# Patient Record
Sex: Male | Born: 1960 | Race: White | Hispanic: No | Smoking: Never smoker
Health system: Southern US, Community
[De-identification: ages and names within clinical notes are randomized; demographics above are authoritative.]

## PROBLEM LIST (undated history)

## (undated) DIAGNOSIS — E785 Hyperlipidemia, unspecified: Secondary | ICD-10-CM

## (undated) DIAGNOSIS — M1990 Unspecified osteoarthritis, unspecified site: Secondary | ICD-10-CM

## (undated) DIAGNOSIS — T7840XA Allergy, unspecified, initial encounter: Secondary | ICD-10-CM

## (undated) HISTORY — PX: TONSILLECTOMY: SUR1361

## (undated) HISTORY — DX: Hyperlipidemia, unspecified: E78.5

## (undated) HISTORY — DX: Allergy, unspecified, initial encounter: T78.40XA

## (undated) HISTORY — PX: NASAL SINUS SURGERY: SHX719

## (undated) HISTORY — PX: BACK SURGERY: SHX140

## (undated) HISTORY — PX: HERNIA REPAIR: SHX51

## (undated) HISTORY — DX: Unspecified osteoarthritis, unspecified site: M19.90

---

## 2001-03-26 ENCOUNTER — Emergency Department (HOSPITAL_COMMUNITY): Admission: EM | Admit: 2001-03-26 | Discharge: 2001-03-26 | Payer: Self-pay | Admitting: Emergency Medicine

## 2001-04-02 ENCOUNTER — Encounter: Payer: Self-pay | Admitting: Neurosurgery

## 2001-04-02 ENCOUNTER — Ambulatory Visit (HOSPITAL_COMMUNITY): Admission: RE | Admit: 2001-04-02 | Discharge: 2001-04-02 | Payer: Self-pay | Admitting: Neurosurgery

## 2001-04-07 ENCOUNTER — Encounter: Payer: Self-pay | Admitting: Neurosurgery

## 2001-04-07 ENCOUNTER — Encounter: Admission: RE | Admit: 2001-04-07 | Discharge: 2001-04-07 | Payer: Self-pay | Admitting: Neurosurgery

## 2001-04-19 ENCOUNTER — Encounter: Payer: Self-pay | Admitting: Neurosurgery

## 2001-04-19 ENCOUNTER — Encounter: Admission: RE | Admit: 2001-04-19 | Discharge: 2001-04-19 | Payer: Self-pay | Admitting: Neurosurgery

## 2001-05-04 ENCOUNTER — Encounter: Payer: Self-pay | Admitting: Neurosurgery

## 2001-05-04 ENCOUNTER — Inpatient Hospital Stay (HOSPITAL_COMMUNITY): Admission: RE | Admit: 2001-05-04 | Discharge: 2001-05-05 | Payer: Self-pay | Admitting: Neurosurgery

## 2001-06-16 ENCOUNTER — Encounter: Payer: Self-pay | Admitting: Gastroenterology

## 2002-06-10 ENCOUNTER — Encounter: Admission: RE | Admit: 2002-06-10 | Discharge: 2002-06-10 | Payer: Self-pay | Admitting: Emergency Medicine

## 2002-06-10 ENCOUNTER — Encounter: Payer: Self-pay | Admitting: Emergency Medicine

## 2002-07-20 ENCOUNTER — Inpatient Hospital Stay (HOSPITAL_COMMUNITY): Admission: RE | Admit: 2002-07-20 | Discharge: 2002-07-21 | Payer: Self-pay | Admitting: Neurosurgery

## 2002-07-20 ENCOUNTER — Encounter: Payer: Self-pay | Admitting: Neurosurgery

## 2004-06-24 ENCOUNTER — Ambulatory Visit (HOSPITAL_BASED_OUTPATIENT_CLINIC_OR_DEPARTMENT_OTHER): Admission: RE | Admit: 2004-06-24 | Discharge: 2004-06-24 | Payer: Self-pay | Admitting: Otolaryngology

## 2004-06-24 ENCOUNTER — Ambulatory Visit (HOSPITAL_COMMUNITY): Admission: RE | Admit: 2004-06-24 | Discharge: 2004-06-24 | Payer: Self-pay | Admitting: Otolaryngology

## 2004-11-12 ENCOUNTER — Emergency Department (HOSPITAL_COMMUNITY): Admission: EM | Admit: 2004-11-12 | Discharge: 2004-11-12 | Payer: Self-pay | Admitting: Emergency Medicine

## 2004-12-10 ENCOUNTER — Ambulatory Visit: Payer: Self-pay | Admitting: Family Medicine

## 2004-12-17 ENCOUNTER — Ambulatory Visit: Payer: Self-pay | Admitting: Family Medicine

## 2005-02-03 ENCOUNTER — Ambulatory Visit: Payer: Self-pay | Admitting: Family Medicine

## 2006-07-07 ENCOUNTER — Ambulatory Visit: Payer: Self-pay | Admitting: Family Medicine

## 2006-07-31 ENCOUNTER — Ambulatory Visit (HOSPITAL_BASED_OUTPATIENT_CLINIC_OR_DEPARTMENT_OTHER): Admission: RE | Admit: 2006-07-31 | Discharge: 2006-07-31 | Payer: Self-pay | Admitting: Urology

## 2006-07-31 ENCOUNTER — Encounter (INDEPENDENT_AMBULATORY_CARE_PROVIDER_SITE_OTHER): Payer: Self-pay | Admitting: Specialist

## 2006-08-31 ENCOUNTER — Ambulatory Visit: Payer: Self-pay | Admitting: Family Medicine

## 2006-08-31 LAB — CONVERTED CEMR LAB
Albumin: 3.8 g/dL (ref 3.5–5.2)
Basophils Absolute: 0 10*3/uL (ref 0.0–0.1)
Bilirubin, Direct: 0.1 mg/dL (ref 0.0–0.3)
CO2: 30 meq/L (ref 19–32)
Cholesterol: 144 mg/dL (ref 0–200)
Creatinine, Ser: 0.9 mg/dL (ref 0.4–1.5)
Glucose, Bld: 92 mg/dL (ref 70–99)
HCT: 37.7 % — ABNORMAL LOW (ref 39.0–52.0)
Hemoglobin: 12.6 g/dL — ABNORMAL LOW (ref 13.0–17.0)
LDL Cholesterol: 83 mg/dL (ref 0–99)
MCHC: 33.4 g/dL (ref 30.0–36.0)
Monocytes Absolute: 0.6 10*3/uL (ref 0.2–0.7)
Neutrophils Relative %: 48 % (ref 43.0–77.0)
Potassium: 3.9 meq/L (ref 3.5–5.1)
RDW: 12.1 % (ref 11.5–14.6)
Sodium: 143 meq/L (ref 135–145)
TSH: 0.04 microintl units/mL — ABNORMAL LOW (ref 0.35–5.50)
Total Bilirubin: 0.9 mg/dL (ref 0.3–1.2)
Total Protein: 7.3 g/dL (ref 6.0–8.3)

## 2006-09-07 ENCOUNTER — Ambulatory Visit: Payer: Self-pay | Admitting: Family Medicine

## 2006-10-12 ENCOUNTER — Ambulatory Visit: Payer: Self-pay | Admitting: Family Medicine

## 2006-10-24 ENCOUNTER — Ambulatory Visit: Payer: Self-pay | Admitting: Internal Medicine

## 2006-10-26 ENCOUNTER — Encounter (INDEPENDENT_AMBULATORY_CARE_PROVIDER_SITE_OTHER): Payer: Self-pay | Admitting: General Surgery

## 2006-10-26 ENCOUNTER — Ambulatory Visit (HOSPITAL_COMMUNITY): Admission: RE | Admit: 2006-10-26 | Discharge: 2006-10-27 | Payer: Self-pay | Admitting: General Surgery

## 2007-01-19 DIAGNOSIS — E785 Hyperlipidemia, unspecified: Secondary | ICD-10-CM | POA: Insufficient documentation

## 2007-01-19 DIAGNOSIS — J309 Allergic rhinitis, unspecified: Secondary | ICD-10-CM | POA: Insufficient documentation

## 2007-09-28 DIAGNOSIS — J020 Streptococcal pharyngitis: Secondary | ICD-10-CM | POA: Insufficient documentation

## 2007-09-30 ENCOUNTER — Ambulatory Visit: Payer: Self-pay | Admitting: Family Medicine

## 2007-09-30 DIAGNOSIS — J029 Acute pharyngitis, unspecified: Secondary | ICD-10-CM | POA: Insufficient documentation

## 2007-09-30 LAB — CONVERTED CEMR LAB: Rapid Strep: POSITIVE

## 2007-11-01 ENCOUNTER — Ambulatory Visit: Payer: Self-pay | Admitting: Family Medicine

## 2007-12-27 ENCOUNTER — Encounter (INDEPENDENT_AMBULATORY_CARE_PROVIDER_SITE_OTHER): Payer: Self-pay | Admitting: Otolaryngology

## 2007-12-27 ENCOUNTER — Ambulatory Visit (HOSPITAL_BASED_OUTPATIENT_CLINIC_OR_DEPARTMENT_OTHER): Admission: RE | Admit: 2007-12-27 | Discharge: 2007-12-27 | Payer: Self-pay | Admitting: Otolaryngology

## 2008-11-26 DIAGNOSIS — N200 Calculus of kidney: Secondary | ICD-10-CM | POA: Insufficient documentation

## 2008-11-30 ENCOUNTER — Ambulatory Visit: Payer: Self-pay | Admitting: Cardiology

## 2008-11-30 ENCOUNTER — Ambulatory Visit: Payer: Self-pay | Admitting: Family Medicine

## 2008-11-30 DIAGNOSIS — M545 Low back pain, unspecified: Secondary | ICD-10-CM | POA: Insufficient documentation

## 2008-11-30 LAB — CONVERTED CEMR LAB
Nitrite: NEGATIVE
Specific Gravity, Urine: 1.015
pH: 6

## 2009-08-15 ENCOUNTER — Ambulatory Visit: Payer: Self-pay | Admitting: Family Medicine

## 2009-08-15 LAB — CONVERTED CEMR LAB
ALT: 18 units/L (ref 0–53)
AST: 20 units/L (ref 0–37)
Alkaline Phosphatase: 75 units/L (ref 39–117)
BUN: 12 mg/dL (ref 6–23)
Basophils Absolute: 0 10*3/uL (ref 0.0–0.1)
Bilirubin, Direct: 0 mg/dL (ref 0.0–0.3)
Blood in Urine, dipstick: NEGATIVE
Calcium: 9.1 mg/dL (ref 8.4–10.5)
Cholesterol: 154 mg/dL (ref 0–200)
Eosinophils Relative: 1 % (ref 0.0–5.0)
GFR calc non Af Amer: 95.21 mL/min (ref >60)
HCT: 43.4 % (ref 39.0–52.0)
HDL: 38.7 mg/dL — ABNORMAL LOW (ref >39.00)
LDL Cholesterol: 93 mg/dL (ref 0–99)
Lymphocytes Relative: 36.6 % (ref 12.0–46.0)
Lymphs Abs: 2 10*3/uL (ref 0.7–4.0)
Monocytes Relative: 10.8 % (ref 3.0–12.0)
Platelets: 239 10*3/uL (ref 150.0–400.0)
Potassium: 3.8 meq/L (ref 3.5–5.1)
Protein, U semiquant: NEGATIVE
RDW: 13.4 % (ref 11.5–14.6)
Sodium: 145 meq/L (ref 135–145)
TSH: 0.03 microintl units/mL — ABNORMAL LOW (ref 0.35–5.50)
Total Bilirubin: 0.8 mg/dL (ref 0.3–1.2)
Urobilinogen, UA: 0.2
VLDL: 22 mg/dL (ref 0.0–40.0)
WBC Urine, dipstick: NEGATIVE
WBC: 5.5 10*3/uL (ref 4.5–10.5)

## 2009-08-23 ENCOUNTER — Ambulatory Visit: Payer: Self-pay | Admitting: Family Medicine

## 2009-08-23 DIAGNOSIS — N508 Other specified disorders of male genital organs: Secondary | ICD-10-CM | POA: Insufficient documentation

## 2009-08-23 DIAGNOSIS — E079 Disorder of thyroid, unspecified: Secondary | ICD-10-CM | POA: Insufficient documentation

## 2009-08-29 ENCOUNTER — Ambulatory Visit: Payer: Self-pay | Admitting: Family Medicine

## 2009-08-29 LAB — CONVERTED CEMR LAB
Free T4: 0.8 ng/dL (ref 0.6–1.6)
T3, Free: 4.4 pg/mL — ABNORMAL HIGH (ref 2.3–4.2)

## 2009-08-31 DIAGNOSIS — D2330 Other benign neoplasm of skin of unspecified part of face: Secondary | ICD-10-CM | POA: Insufficient documentation

## 2009-08-31 DIAGNOSIS — D369 Benign neoplasm, unspecified site: Secondary | ICD-10-CM | POA: Insufficient documentation

## 2010-05-28 NOTE — Assessment & Plan Note (Signed)
Summary: lesion removal ok per doc/njr   Procedure Note Last Tetanus: Tdap (08/23/2009)  Mole Biopsy/Removal: Indication: rule out cancer Consent signed: yes  Procedure # 1: elliptical incision with 2 mm margin    Size (in cm): 0.6 x 0.6    Region: anterior    Location: nose    Instrument used: #15 blade    Anesthesia: 1% lidocaine w/o epinephrine    Closure: caut  Procedure # 2: elliptical incision with 2 mm margin    Size (in cm): 0.5 x 0.5    Region: anterior    Location: chin    Instrument used: #15 blade    Anesthesia: 1% lidocaine w/o epinephrine    Closure: cautery  Cleaned and prepped with: alcohol Wound dressing: bandaid   History of Present Illness: [name] a 50 year old male, married, nonsmoker, Emergency planning/management officer, who comes in today for removal of two lesions on his face.    Allergies: 1)  ! Celebrex (Celecoxib)   Complete Medication List: 1)  Zocor 10 Mg Tabs (Simvastatin) .... Take 1 tablet by mouth at bedtime 2)  Aspirin 325 Mg Tbec (Aspirin) .... Once daily  Other Orders: Gastroenterology Referral (GI) Shave Skin Lesion 0.6-1.0cm face/ears/eyelids/nose/lips/mm (11311) Shave Skin Lesion < 0.5cm face/ears/eyelids/nose/lips/mm (11310)

## 2010-05-28 NOTE — Miscellaneous (Signed)
Summary: Consent for Lesion Removal  Consent for Lesion Removal   Imported By: Maryln Gottron 09/03/2009 11:25:18  _____________________________________________________________________  External Attachment:    Type:   Image     Comment:   External Document

## 2010-05-28 NOTE — Procedures (Signed)
Summary: EGD Report   EGD  Procedure date:  06/16/2001  Findings:      Findings: Stricture:  [facility]   Patient Name: [name], [name] MRN:  Procedure Procedures: Panendoscopy (EGD) CPT: 43235.    with esophageal dilation. CPT: G9296129.  Personnel: Endoscopist: Barbette Hair. Arlyce Dice, MD.  Referred By: Eugenio Hoes Tawanna Cooler, MD.  Indications Symptoms: Dysphagia.  History  Pre-Exam Physical: Performed Jun 16, 2001  Entire physical exam was normal.  Exam Exam Info: Maximum depth of insertion Duodenum, intended Duodenum. ASA Classification: I. Tolerance: excellent.  Sedation Meds: Robinul 0.2 given IV. Fentanyl 50 mcg. given IV. Versed 5 mg. given IV. Cetacaine Spray 1 sprays given aerosolized.  Monitoring: BP and pulse monitoring done. Oximetry used. Supplemental O2 given at 2 Liters.  Findings HIATAL HERNIA:  STRICTURE / STENOSIS: Stricture in Distal Esophagus.  Constriction: partial. 40 cm from mouth. ICD9: Esophageal Stricture: 530.3.  - Dilation: Distal Esophagus. Maloney dilator used, Diameter: 17 mm, Minimal Resistance, Outcome: successful.   Assessment Abnormal examination, see findings above.  Diagnoses: 530.3: Esophageal Stricture.   Events  Unplanned Intervention: No unplanned interventions were required.  Unplanned Events: There were no complications. Plans Medication(s): Continue current medications.  Scheduling: Follow-up prn.    cc: Tinnie Gens A. Tawanna Cooler, MD  This report was created from the original endoscopy report, which was reviewed and signed by the above listed endoscopist.

## 2010-05-28 NOTE — Assessment & Plan Note (Signed)
Summary: PHYSICAL/CB   Vital Signs:  Patient profile:   50 year old male Height:      73 inches Weight:      231 pounds Temp:     98.9 degrees F oral BP sitting:   120 / 80  (left arm) Cuff size:   regular  Vitals Entered By: Kern Reap CMA Duncan Dull) (August 23, 2009 1:51 PM) CC: cpx Is Patient Diabetic? No Pain Assessment Patient in pain? no        CC:  cpx.  History of Present Illness: Thurmon is a 50 year old, married male, nonsmoker, who comes in today for general physical examination and to evaluate hyperlipidemia, and 4  other new problems. he has underlying hyperlipidemia, treated with Zocor 10 mg nightly, but preserved gall continue above therapy.  D. for new problems are as follows.  He has some soreness in his right great toe.  He had this problem before.  It was an ingrown toenail.  However, he is not having redness, swelling, or pus.  He has a bump on the midline of his chin and also a changing lesion on the left side of his nose.  He would like evaluated.  Is also noticed a lump in the shaft of his penis.  routine eye and  dental care.  Tetanus status two 2001.  Booster today    Allergies: 1)  ! Celebrex (Celecoxib)  Past History:  Past medical, surgical, family and social histories (including risk factors) reviewed, and no changes noted (except as noted below).  Past Medical History: Reviewed history from 09/30/2007 and no changes required. Allergic rhinitis Hyperlipidemia lumbar disk surgery x 2  Past Surgical History: Reviewed history from 01/19/2007 and no changes required. lumbar disc surgery X2  Family History: Reviewed history from 09/30/2007 and no changes required.  father has allergic rhinitismother has a liver condition unknown etiology  no sisters4 brothers 3 good health.  One died of suicide.  He was an alcoholic  Social History: Reviewed history from 09/30/2007 and no changes required. Occupation:[employer] Married Never Smoked Alcohol use-no Drug use-no Regular exercise-yes  Review of Systems      See HPI  Physical Exam  General:  Well-developed,well-nourished,in no acute distress; alert,appropriate and cooperative throughout examination Head:  Normocephalic and atraumatic without obvious abnormalities. No apparent alopecia or balding. Eyes:  No corneal or conjunctival inflammation noted. EOMI. Perrla. Funduscopic exam benign, without hemorrhages, exudates or papilledema. Vision grossly normal. Ears:  External ear exam shows no significant lesions or deformities.  Otoscopic examination reveals clear canals, tympanic membranes are intact bilaterally without bulging, retraction, inflammation or discharge. Hearing is grossly normal bilaterally. Nose:  External nasal examination shows no deformity or inflammation. Nasal mucosa are pink and moist without lesions or exudates. Mouth:  Oral mucosa and oropharynx without lesions or exudates.  Teeth in good repair. Neck:  No deformities, masses, or tenderness noted. Chest Wall:  No deformities, masses, tenderness or gynecomastia noted. Breasts:  No masses or gynecomastia noted Lungs:  Normal respiratory effort, chest expands symmetrically. Lungs are clear to auscultation, no crackles or wheezes. Heart:  Normal rate and regular rhythm. S1 and S2 normal without gallop, murmur, click, rub or other extra sounds. Abdomen:  Bowel sounds positive,abdomen soft and non-tender without masses, organomegaly or hernias noted. Rectal:  No external abnormalities noted. Normal sphincter tone. No rectal masses or tenderness. Genitalia:  Testes bilaterally descended without nodularity, tenderness or masses. No scrotal masses or lesions. No penis lesions or urethral  discharge. Prostate:  Prostate gland firm and smooth, no enlargement, nodularity, tenderness, mass, asymmetry or induration. Msk:  No deformity or scoliosis noted of thoracic or lumbar spine.    Pulses:  R and L carotid,radial,femoral,dorsalis pedis and posterior tibial pulses are full and equal bilaterally Extremities:  No clubbing, cyanosis, edema, or deformity noted with normal full range of motion of all joints.   Neurologic:  No cranial nerve deficits noted. Station and gait are normal. Plantar reflexes are down-going bilaterally. DTRs are symmetrical throughout. Sensory, motor and coordinative functions appear intact. Skin:  total body skin exam normal except for a lesion on his chin and the left side of his nose.  Return for removal ASAP Cervical Nodes:  No lymphadenopathy noted Axillary Nodes:  No palpable lymphadenopathy Inguinal Nodes:  No significant adenopathy Psych:  Cognition and judgment appear intact. Alert and cooperative with normal attention span and concentration. No apparent delusions, illusions, hallucinations   Impression & Recommendations:  Problem # 1:  HYPERLIPIDEMIA (ICD-272.4) Assessment Improved  His updated medication list for this problem includes:    Zocor 10 Mg Tabs (Simvastatin) .Marland Kitchen... 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 Take 1 tablet by mouth at bedtime 2)  Aspirin 325 Mg Tbec (Aspirin) .... Once daily  Other Orders: Tdap => 22yrs IM (96295) Admin 1st  Vaccine (28413)  Patient Instructions: 1)  return next week for a 30 minute appointment in the treatment room for removal of the two lesions we discussed 2)  Take an Aspirin every day. 3)  Please schedule a follow-up appointment in 1 year. Prescriptions: ZOCOR 10 MG  TABS (SIMVASTATIN) Take 1 tablet by mouth at bedtime  #100 x 3   Entered and Authorized by:   Roderick Pee MD   Signed by:   Roderick Pee MD on 08/23/2009   Method used:   Electronically to        [phone]* (retail)       [address]       [place], [place]  [address]       Ph: [phone] or [phone]       Fax: [phone]   RxID:   [id number]    Immunizations Administered:  Tetanus Vaccine:    Vaccine Type: Tdap    Site: left deltoid    Mfr: GlaxoSmithKline    Dose: 0.5 ml    Route: IM    Given by: Kern Reap CMA (AAMA)    Exp. Date: 07/21/2011    Lot #: [id number]    Physician counseled: yes

## 2010-06-22 IMAGING — CT CT PELVIS W/O CM
2 of 4 series · 17 of 46 positions shown, 19 images · non-contrast
Comparison: None.

CT ABDOMEN

CLINICAL DATA: Right flank pain, hematuria

CT ABDOMEN AND PELVIS WITHOUT CONTRAST
TECHNIQUE: Multidetector CT imaging of the abdomen and pelvis was
performed following the standard protocol without intravenous
contrast.

[Series 2: 5mm stone study - 160 eff. mas · axial · 0.84mm/px · z∈[-554,-104]mm · 14 of 100 slices shown, 16 images]
[im 5/100  soft-tissue]
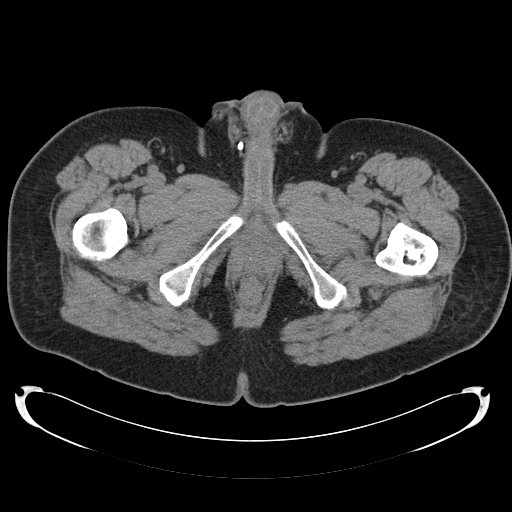
[im 5/100  bone]
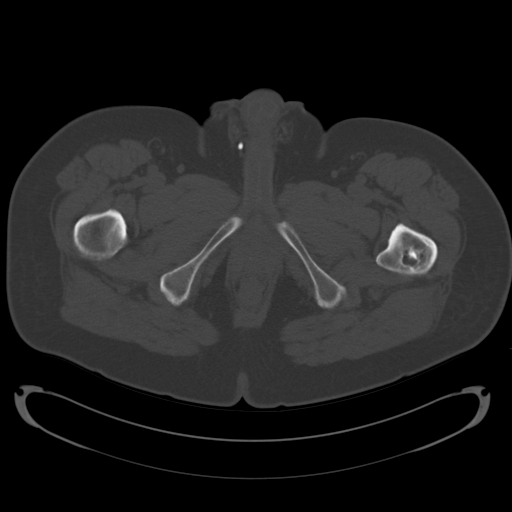
[im 13/100  soft-tissue]
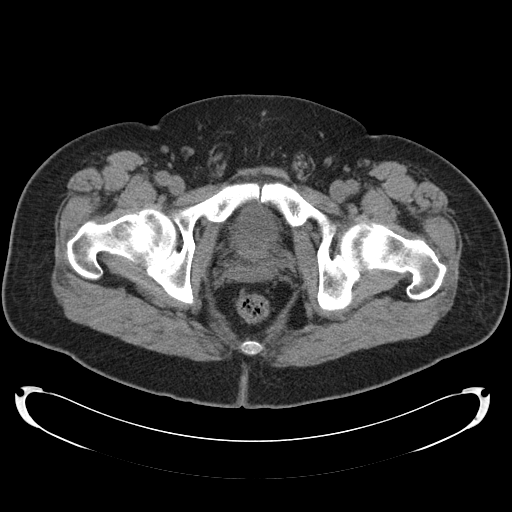
[im 21/100  soft-tissue]
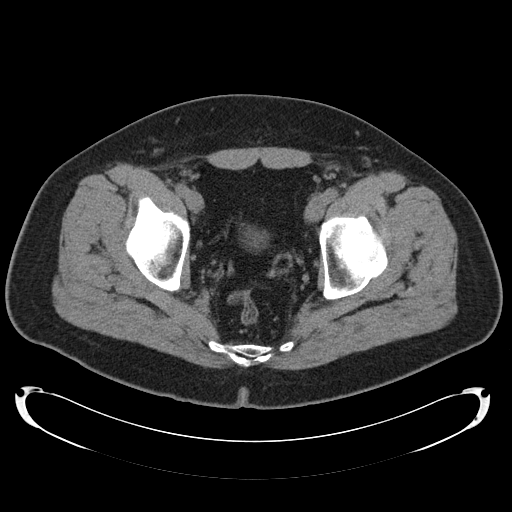
[im 25/100  soft-tissue]
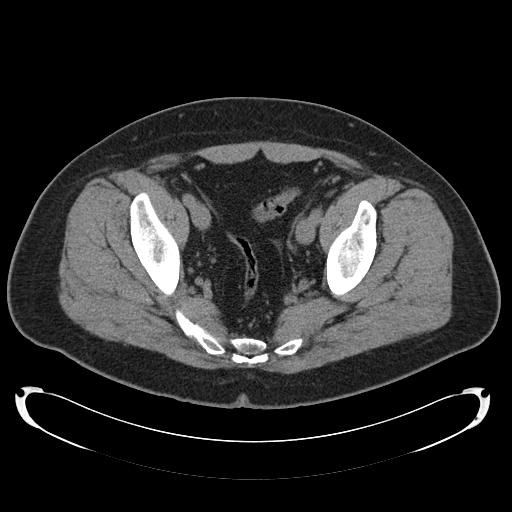
[im 34/100  soft-tissue]
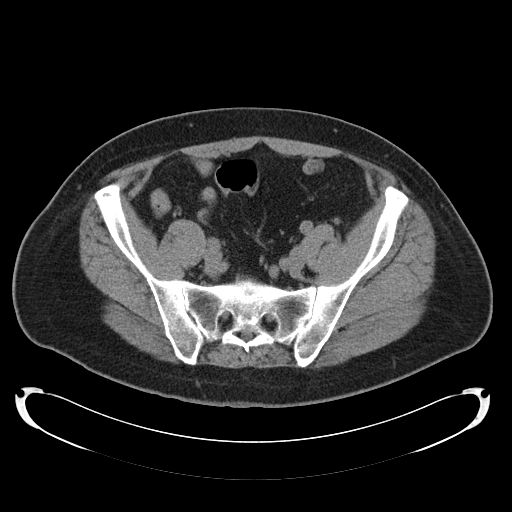
[im 42/100  soft-tissue]
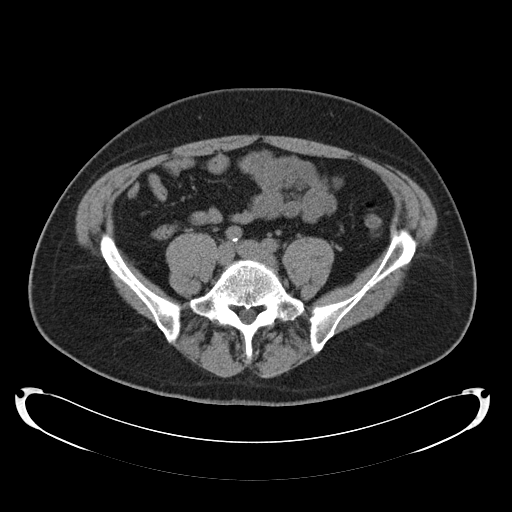
[im 46/100  soft-tissue]
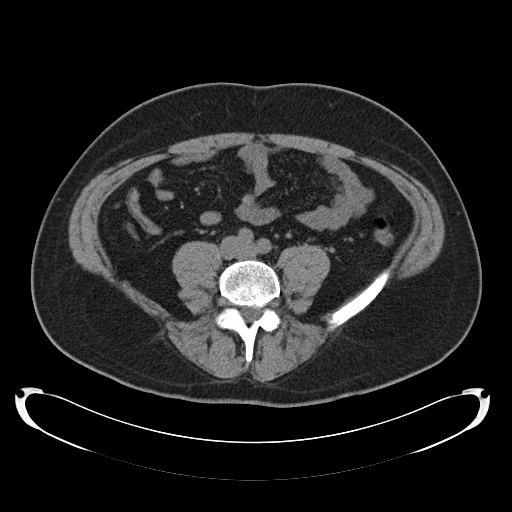
[im 54/100  soft-tissue]
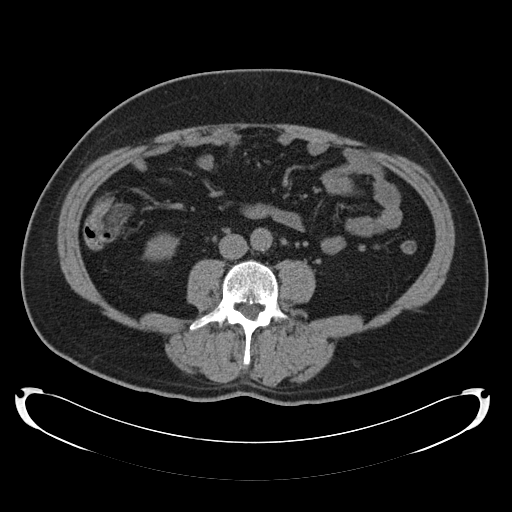
[im 58/100  soft-tissue]
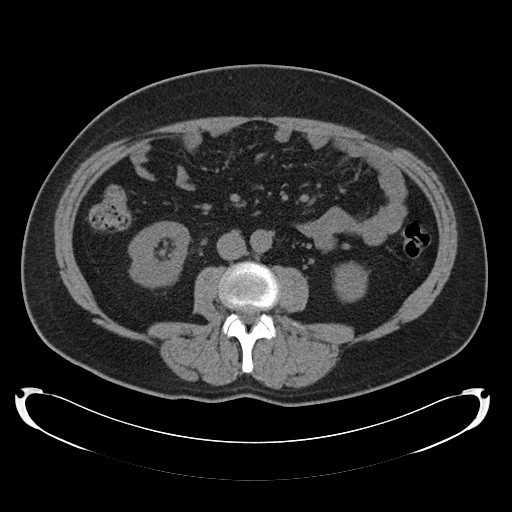
[im 58/100  bone]
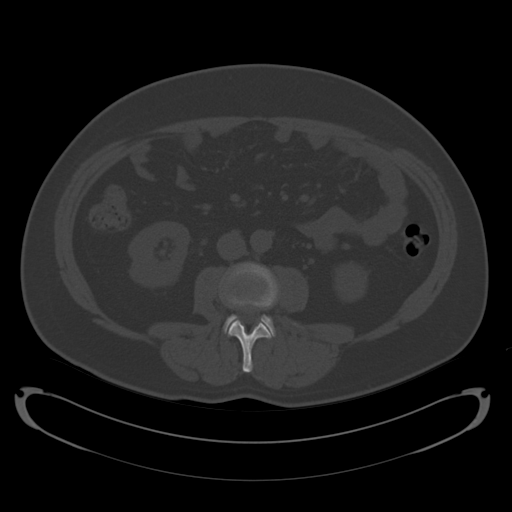
[im 67/100  soft-tissue]
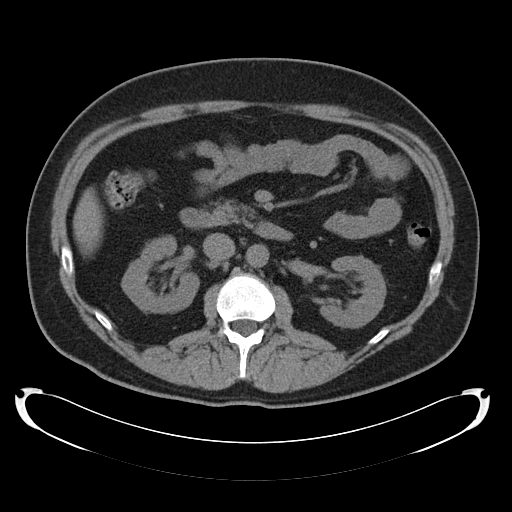
[im 75/100  soft-tissue]
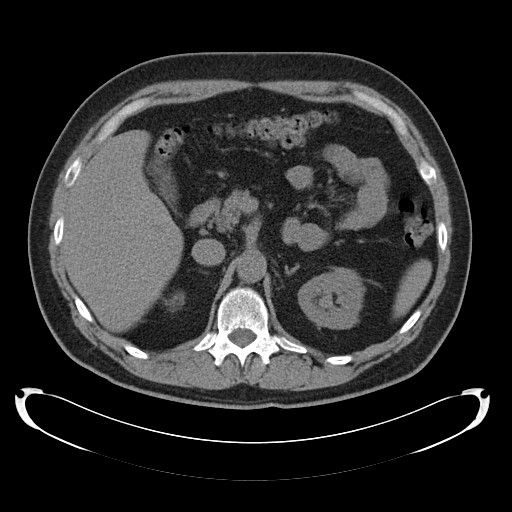
[im 79/100  soft-tissue]
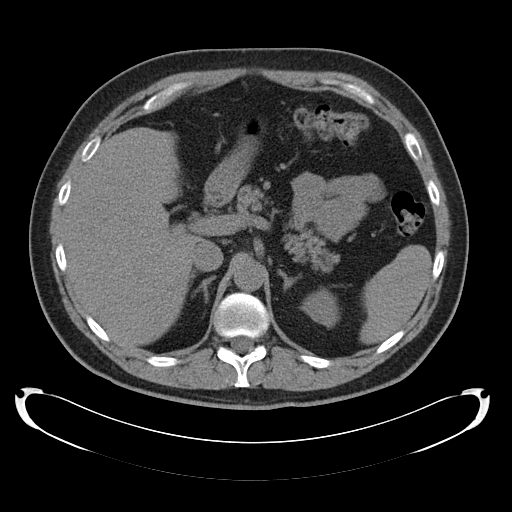
[im 87/100  soft-tissue]
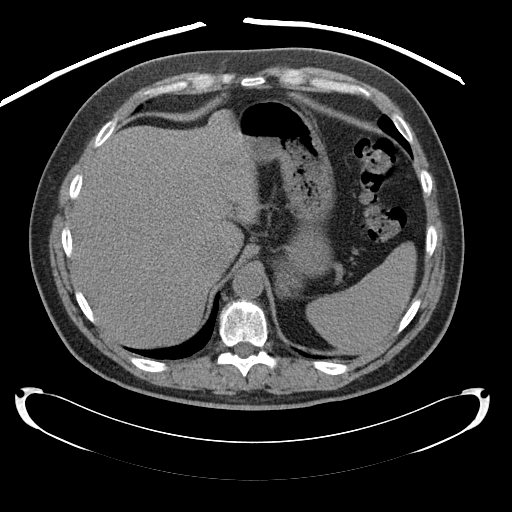
[im 95/100  soft-tissue]
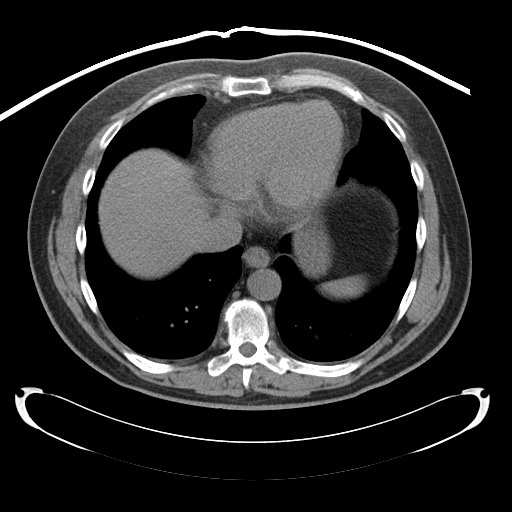

[Series 602: <mpr range> · coronal · 0.99mm/px · 3 of 121 slices shown]
[im 41/121  soft-tissue]
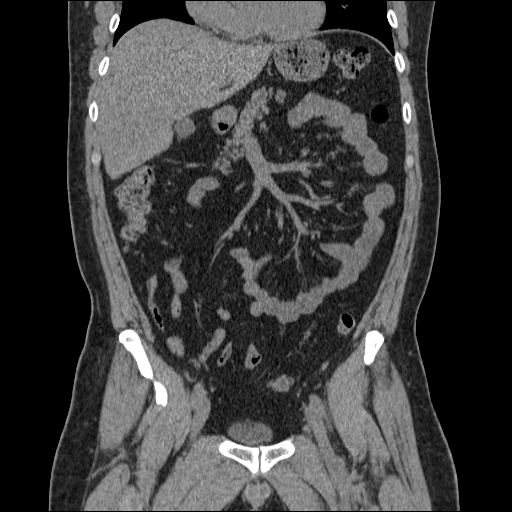
[im 54/121  soft-tissue]
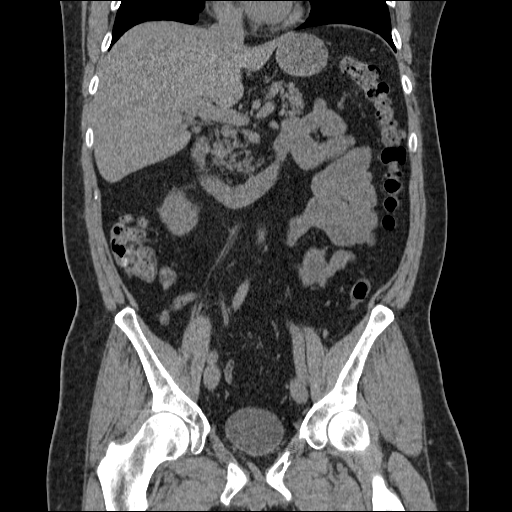
[im 67/121  soft-tissue]
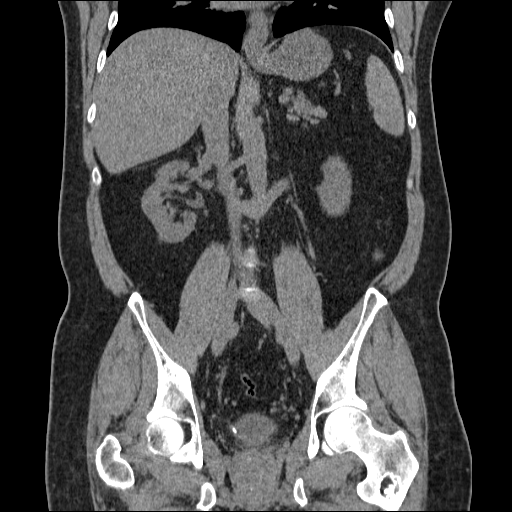

[17 of 46 positions shown; findings below may reference images not displayed]

FINDINGS: No acute hydronephrosis or significant perinephric
inflammation.  Ureters are symmetric and decompressed.  No
hydroureter or periureteral inflammation by CT.  Punctate
nonobstructing left renal calculus in the mid pole, image 36.  No
other upper urinary tract radiodense calculi.

Lung bases visualized are clear.

Within the limits of noncontrast imaging, the liver, gallbladder,
biliary system, adrenal glands, spleen, and pancreas are within
normal limits for age.  Negative for bowel obstruction, dilatation,
bowel wall thickening or ileus.  No free air.  Retroaortic left
renal vein noted, a normal variant.
IMPRESSION: No acute obstructive uropathy
Punctate nonobstructing left renal calculus
No acute intra-abdominal finding by noncontrast CT

CT PELVIS
FINDINGS: At the right UVJ, there is a nonobstructing calculus on
images 84 and 85 along the posterior right bladder wall measuring 3
mm.  No other distal pelvic urinary tract calculus identified.

No acute pelvic inflammation, free fluid, fluid collection,
hemorrhage, adenopathy, or inguinal hernia.  No distal acute bowel
process in the pelvis.

In the left hip intertrochanteric region, there is a lucent bone
lesion noted with sclerotic margins and calcification.  Centrally
within the lesion fat density is detected.  The abnormality has a
benign appearance but remains nonspecific.  This could represent an
intraosseous lipoma.
IMPRESSION: Nonobstructing 3 mm right UVJ calculus.
Left hip intertrochanteric bone lesion, appears benign and may
represent an intraosseous lipoma.

## 2010-09-10 NOTE — Op Note (Signed)
NAMEECHO, ALLSBROOK               ACCOUNT NO.:  [id number]   MEDICAL RECORD NO.:  [id number]          PATIENT TYPE:  AMB   LOCATION:  DSC                          FACILITY:  [facility]   PHYSICIAN:  Christopher E. Ezzard Standing, M.D.DATE OF BIRTH:  Sep 01, 1960   DATE OF PROCEDURE:  12/27/2007  DATE OF DISCHARGE:                               OPERATIVE REPORT   PREOPERATIVE DIAGNOSIS:  Chronic right tonsillitis with right tonsillar  hypertrophy.   POSTOPERATIVE DIAGNOSIS:  Chronic right tonsillitis with right tonsillar  hypertrophy.   OPERATION:  Tonsillectomy.   SURGEON:  Kristine Garbe. Ezzard Standing, MD   ANESTHESIA:  General endotracheal.   COMPLICATIONS:  None.   BRIEF CLINICAL NOTE:  [name] is a 50 year old gentleman who has  had chronic right-sided sore throat for several months now.  He has been  treated with couple rounds of antibiotics and seemed to get temporally  better but had recurrent symptoms with recurrent right-sided sore  throat.  On exam, he had enlarged right tonsil and enlarged in the left.  He is taken to operating room at this time for tonsillectomy.   DESCRIPTION OF PROCEDURE:  After adequate endotracheal anesthesia, mouth  gag was used to expose the oropharynx.  The left and right tonsils were  resected from tonsillar fossae using cautery.  Tonsils were sent  separately to pathology.  Hemostasis was obtained with the cautery.  After obtaining adequate hemostasis, the procedure was completed.  The  oropharynx was irrigated with saline.  Orvie was awoken from  anesthesia and transferred to recovery room.  Postop, doing well.   DISPOSITION:  Eliav was discharged home by this morning on amoxicillin  suspension 500 mg b.i.d. for 1 week, Tylenol and Lortab elixir 50 and 30  mL q.4 h p.r.n. pain.  I will have him follow up in my office in 2 weeks  for recheck.           ______________________________  Kristine Garbe. Ezzard Standing, M.D.     CEN/MEDQ  D:  12/27/2007   T:  12/28/2007  Job:  [id number]

## 2010-09-10 NOTE — Assessment & Plan Note (Signed)
[facility]                                 ON-CALL NOTE   [name], [name]                      MRN:          [id number]  DATE:10/24/2006                            DOB:          [birth year]    Patient of Dr. Tawanna Cooler.  Phone call at 12:34 p.m. on June 28, [phone].  He  has had problems with a hemorrhoid since yesterday, really got bad last  night at work.  He is coming into the clinic before we close for  evaluation.     Karie Schwalbe, MD  Electronically Signed    RIL/MedQ  DD: 10/24/2006  DT: 10/24/2006  Job #: [id number]   cc:   Tinnie Gens A. Tawanna Cooler, MD

## 2010-09-10 NOTE — H&P (Signed)
NAMEPARTHIV, [name]               ACCOUNT NO.:  [id number]   MEDICAL RECORD NO.:  [id number]          PATIENT TYPE:  AMB   LOCATION:  DAY                          FACILITY:  [facility]   PHYSICIAN:  Wilmon Arms. Corliss Skains, M.D. DATE OF BIRTH:  10-06-1960   DATE OF ADMISSION:  10/26/2006  DATE OF DISCHARGE:                              HISTORY & PHYSICAL   CHIEF COMPLAINT:  Severe perianal pain and bleeding.   The patient is a 50 year old male in otherwise good health who has had  previous problems with hemorrhoids.  The patient presents with worsening  perianal pain and swelling since last Thursday.  Over the weekend, it  became very severe.  He was seen by [facility] on call and was  given some type of cortisone ointment which he says has actually made  things worse.  He comes into the urgent office at our clinic today.  He  is in significant discomfort.  He had a handful of [place] fries while en  route around 2:30 p.m. today.   PAST MEDICAL HISTORY:  Hypercholesterolemia.   PAST SURGICAL HISTORY:  1. Back surgery x2.  2. ENT surgery February 2006 which included a turbinate reduction and      uvulectomy by Dr. Dillard Cannon.  3. He also underwent circumcision and vasectomy earlier this year.   ALLERGIES:  CELEBREX.   MEDICATIONS:  Zocor and aspirin.   SOCIAL HISTORY:  Nonsmoker. The patient drinks very little.  He is a  Event organiser.   FAMILY HISTORY:  Noncontributory.   REVIEW OF SYSTEMS:  Negative other than for hemorrhoids.   PHYSICAL EXAMINATION:  VITAL SIGNS: Height 6 feet 1 inch, weight 224.  Blood pressure 125/79, pulse 61, temperature 98.9.  GENERAL:  This is a well-developed, well-nourished male in no apparent  distress.  HEENT:  EOMI. Sclerae anicteric.  NECK:  No masses, no thyromegaly.  LUNGS:  Clear.  Normal respiratory effort.  HEART:  Regular rate and rhythm, no murmur.  ABDOMEN:  Soft and nontender.  RECTAL:  Perianal examination  shows what appears to be 2 very large  external hemorrhoids.  The one on the left is  pink and soft.  The one  of the right is dark purple, almost black.  On posterior examination,  there is no sign of perirectal abscess.  The right-sided hemorrhoid  turns out to actually be a prolapsed internal hemorrhoid which has  become strangulated.   IMPRESSION:  Strangulated prolapsed internal hemorrhoid.   PLAN:  I attempted to manually reduce this with firm gentle pressure in  the office.  I was able to mostly reduce this, but as soon as I released  the pressure, it recurred immediately.  I discussed the situation with  the on-call physician, Dr. Abbey Chatters, and we will admit the patient,  keep him n.p.o., and perform examination under anesthesia with  hemorrhoidectomy at the next available operative slot.      Wilmon Arms. Tsuei, M.D.  Electronically Signed     MKT/MEDQ  D:  10/26/2006  T:  10/26/2006  Job:  [id number]

## 2010-09-10 NOTE — Op Note (Signed)
[name], [name]               ACCOUNT NO.:  [id number]   MEDICAL RECORD NO.:  [id number]          PATIENT TYPE:  OIB   LOCATION:  1621                         FACILITY:  [facility]   PHYSICIAN:  Adolph Pollack, M.D.DATE OF BIRTH:  March 03, 1961   DATE OF PROCEDURE:  10/26/2006  DATE OF DISCHARGE:                               OPERATIVE REPORT   PREOPERATIVE DIAGNOSIS:  Thrombosed necrotic internal hemorrhoids.   POSTOPERATIVE DIAGNOSIS:  Thrombosed necrotic internal hemorrhoids, left  lateral and right posterolateral positions.   PROCEDURE:  Internal hemorrhoidectomy x2 in the left lateral and right  posterolateral positions.   SURGEON:  Adolph Pollack, M.D.   ANESTHESIA:  General anesthesia.   INDICATIONS FOR PROCEDURE:  This 50 year old male has had some worsening  perianal pain, bleeding and diarrhea over the past four days.  He has a  previous history of a thrombosed external hemorrhoid in 2006.  He was  seen in the Urgent Office and  had what appeared to be a necrotic,  prolapsed internal hemorrhoid.  He is now brought to the operating room  for an examination under anesthesia and a hemorrhoidectomy.  I discussed  the procedure and the risks, including but not limited to bleeding,  infection and incontinence.   DESCRIPTION OF PROCEDURE:  He is brought to the operating room and  placed supine on the operating room table and general anesthetic was  administered.  He was then placed in the lithotomy position.  The  perianal area was sterilely prepped and draped.  On the left lateral  aspect there was a prolapsed black necrotic internal hemorrhoid, and  there was also a larger one in the right posterolateral position.  Surrounding this was some edema in the external component, but that area  was viable.  Using electrocautery, I excised both of these hemorrhoids,  the left side and the right side.  I was able to control the left side  hemostasis just with cautery.  The  right side continued to have some  bloating because of the induration.  I subsequently closed this mucosal  defect with a running locking #2-0 chromic suture with a successful  control of hemostasis.  I observed it for at least five to 10 minutes,  and no further bleeding was noted.  I then injected some Wydase mixed  with Marcaine into the perianal area and massaged this in.   Following this, I inspected the area once again and hemostasis was  adequate.  I placed Gelfoam in the anal canal.  I then covered it with a  bulky dressing.   He tolerated the procedure well without any apparent complications and  was taken to the recovery room in satisfactory condition.  He will be  observed overnight for possible bleeding.     Adolph Pollack, M.D.  Electronically Signed    TJR/MEDQ  D:  10/26/2006  T:  10/27/2006  Job:  [id number]

## 2010-09-13 NOTE — Op Note (Signed)
NAME:  [name], [name]                         ACCOUNT NO.:  [id number]   MEDICAL RECORD NO.:  [id number]                   PATIENT TYPE:  INP   LOCATION:  NA                                   FACILITY:  [facility]   PHYSICIAN:  Hewitt Shorts, M.D.            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   PROCEDURE:  Left L4-5 lumbar laminotomy and microdiskectomy.   SURGEON:  Hewitt Shorts, M.D.   ASSISTANT:  Danae Orleans. Venetia Maxon, M.D.   ANESTHESIA:  General endotracheal.   INDICATIONS:  The patient is a 50 year old man who had a previous left L4-5  diskectomy done, who suffered a recurrent radiculopathy and was found by MRI  scan to have a recurrent lumbar disk herniation.  A decision was made to  proceed with a left hemilaminotomy and microdiskectomy.   DESCRIPTION OF PROCEDURE:  The patient was brought to the operating room and  placed under general endotracheal anesthesia.  The patient was turned to the  prone position, and the lumbar region was prepped with Betadine soap and  solution.  The previous incision was marked, and an x-ray was taken, the L4-  5 level identified, and the portion of the incision over the L4-5 level was  reopened and dissection was carried down through the subcutaneous tissue  with bipolar cautery and electrocautery used to maintain hemostasis.  Dissection was carried down to the lumbar fascia, which was incised on the  left side of midline, and the paraspinal muscle was dissected from the  spinous processes and laminae in a subperiosteal fashion.  The L4-5  interlaminar space was identified and an x-ray was taken to confirm the  localization, and then the microscope was draped and brought into the field  to provide  additional magnification, illumination, and visualization, and  the remainder of the procedure was performed using microdissection and  microsurgical technique.  The scar tissue within the previous laminotomy was  carefully dissected away.  We, in fact, did not need to perform any further  bone removal.  We were able to identify the thecal sac and gradually exposed  the left L5 nerve root.  These structures were gradually retracted medially  and the disk herniation identified.  We entered into the disk space and  proceeded with a thorough diskectomy using a variety of pituitary rongeurs.  All loose fragments of disk material were removed, and we were able to  decompress the thecal sac and nerve root.  Once all loose fragments of disk  material were removed from both disk space and the epidural space, we  checked the epidural space for hemostasis, which was established with the  use of bipolar cautery.  Once  decompression was once again reconfirmed and  hemostasis once again reconfirmed, we instilled 2 mL of fentanyl and 80 mg  of Depo-Medrol into the epidural space, and we proceeded with the closure.  The deep fascia was closed with interrupted, undyed 0 Vicryl suture, and the  subcutaneous and subcuticular layer were closed with interrupted, inverted 2-  0 undyed Vicryl sutures, and the skin was reapproximated with Dermabond.  The patient tolerated the  procedure well.  The estimated blood loss was 25 mL.  Sponge and needle  count were correct.  Following the surgery the patient was turned back to  the supine position to be reversed from his anesthetic, extubated, and  transferred to the recovery room for further care.                                               Hewitt Shorts, M.D.    RWN/MEDQ  D:  07/20/2002  T:  07/21/2002  Job:  [id number]
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
## 2010-09-13 NOTE — Op Note (Signed)
[place]. [facility]  Patient:    [name], [name] Visit Number: [id number] MRN: [id number]          Service Type: SUR Location: [id number] Attending Physician:  Emeterio Reeve Dictated by:   Payton Doughty, M.D. Proc. Date: 05/04/01 Admit Date:  05/04/2001                             Operative Report  PREOPERATIVE DIAGNOSIS:  Herniated disk, L4-5 and L3-4.  POSTOPERATIVE DIAGNOSIS:  Herniated disk, L4-5.  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  OPERATION PERFORMED:  L4-5 laminectomy and diskectomy and L3-4 laminotomy and foraminotomy, both done on the left side.  SURGEON:  Payton Doughty, M.D.  ASSISTANTBasilia Jumbo.  ANESTHESIA:  General endotracheal.  PREP:  Sterile Betadine prep and scrub with alcohol wipe.  COMPLICATIONS:  None.  DESCRIPTION OF PROCEDURE:  The patient is a 50 year old right-handed, white gentleman with left radicular pain in the leg in L5 distribution with MRI showing disk disease at both 3-4 and 4-5, 4-5 slightly more eccentric to the left side.  L3-4 with some blood on the T2 images.  The patient was taken to the operating room, smoothly anesthetized and intubated, and placed prone on the operating table.  Following shave, prep and drape in the usual sterile fashion, skin was infiltrated with 1% lidocaine 1:400,000 epinephrine.  The skin was incised from the top of L3 to the bottom of L4 and the lamina of L3 and L4 exposed on the left side in the subperiosteal plane.  Intraoperative x-ray confirmed correctness of the level.  Hemisemilaminectomy of L4 was carried out to the top of the ligamentum flavum.  It was removed in retrograde fashion.  The lateral recess decompressing the L5 nerve root identified and gently retracted medially.  Underneath it was fairly prominent disk herniation with subligamentous fragments.  The remaining annular fibers were divided and this disk evacuated.  Lateral recess decompressed and the nerve root  thus decompressed.  Careful exploration of the disk space removed all marginally clean fragments.  The nerve root was explored in all quadrants and found to be free.  Attention was then turned to the 3-4 level where hemisemilaminectomy of L3 was carried out to the top ligamentum flavum which was removed in retrograde fashion.  There was moderate recess narrowing secondary to ligamentum flavum.  The 3 root was identified and the 3-4 disk space also identified.  Careful inspection of the disk revealed a small boggy disk that was very soft and causing no nerve compression.  The nerve itself was well decompressed by the laminotomy and foraminotomy and removal of ligamentum flavum.  It was therefore decided not to remove this disk as it would create an annular defect ____________ more disk material out.  The wound was irrigated, hemostasis assured.  Each laminectomy defect covered with Depo-Medrol soaked fat.  The fascia was reapproximated with 0 Vicryl in interrupted fashion.  The subcutaneous tissues were reapproximated with 0 Vicryl in interrupted fashion and the skin was closed with 3-0 nylon in running locked fashion.  A bacitracin and Telfa dressing was applied and made occlusive with Op-Site.  The patient returned to the recovery room in good condition. Dictated by:   Payton Doughty, M.D. Attending Physician:  Emeterio Reeve DD:  05/04/01 TD:  05/05/01 Job: [id number]

## 2010-09-13 NOTE — Op Note (Signed)
[name], [name]               ACCOUNT NO.:  [id number]   MEDICAL RECORD NO.:  [id number]          PATIENT TYPE:  AMB   LOCATION:  DSC                          FACILITY:  [facility]   PHYSICIAN:  Kristine Garbe. Ezzard Standing, M.D.DATE OF BIRTH:  August 19, 1960   DATE OF PROCEDURE:  06/24/2004  DATE OF DISCHARGE:                                 OPERATIVE REPORT   PREOPERATIVE DIAGNOSES:  1.  Turbinate hypertrophy, with nasal obstruction.  2.  Elongated uvula.   POSTOPERATIVE DIAGNOSIS:  1.  Turbinate hypertrophy, with nasal obstruction.  2.  Elongated uvula.   OPERATION:  Bilateral inferior turbinate reductions, uvulectomy.   SURGEON:  Kristine Garbe. Ezzard Standing, M.D.   ANESTHESIA:  General endotracheal.   COMPLICATIONS:  None.   BRIEF CLINICAL NOTE:  [name] is a 50 year old gentleman who has had  chronic problems with nasal congestion for several years.  He has trouble  breathing through his nose.  He has tried nasal steroid sprays, but these  really did not help.  On examination, his septum is relatively midline, but  he has very large turbinates.  In addition, he has an elongated uvula and  also has snoring at night.  He is taken to the operating room at this time  for turbinate reductions to help improve his nasal airway and uvulectomy to  help reduce his snoring.   DESCRIPTION OF PROCEDURE:  After adequate endotracheal anesthesia, the  patient received 10 mg of Decadron IV preoperatively as well as 1 g Ancef.  Turbinate reductions were performed first.  Turbinates were prepped with  cotton pledgets soaked in Afrin, and turbinates were injected with Xylocaine  with epinephrine.  An incision was made along the inferior edge of the  turbinate.  The turbinate mucosa was elevated up off the turbinate bone  medially, and then using turbinate scissors the turbinate bone and lateral  turbinate mucosa was amputated.  Remaining posterior turbinate was  outfractured.  Suction cautery was  used for hemostasis.  This was performed  bilaterally.  Following the procedure, Jonan still had a little bit of  oozing from the turbinatea, and because of this the nose was packed with  Telfa soakecd in bacitracin ointment.   Next, the patient was turned.  A mouth gag was used to expose the  oropharynx.  The uvula was transected at its base with cautery.  This  completed the procedure.  Hemostasis was obtained with cautery.  Vidit was  awakened from anesthesia and transferred to the recovery room  postoperatively doing well.   DISPOSITION:  [name] is discharged home later this morning.  Will have him  follow up in my office tomorrow to have his nasal packs removed.  He was  given amoxicillin suspension 400 mg b.i.d. for five days; along with Tylenol  and Lortab elixir 1-2 tbsp q.4 h. p.r.n. pain.  Will have him follow up in  my office tomorrow to have his nasal packs removed.      CEN/MEDQ  D:  06/24/2004  T:  06/24/2004  Job:  [id number]   cc:   Kristine Garbe. Ezzard Standing, M.D.  [address] [place]  [place] [address]  Fax: [phone]

## 2010-09-13 NOTE — Op Note (Signed)
[name], [name]               ACCOUNT NO.:  [id number]   MEDICAL RECORD NO.:  [id number]          PATIENT TYPE:  AMB   LOCATION:  NESC                         FACILITY:  [facility]   PHYSICIAN:  Terie Purser, MD         DATE OF BIRTH:  1960/12/26   DATE OF PROCEDURE:  07/31/2006  DATE OF DISCHARGE:                               OPERATIVE REPORT   PREOPERATIVE DIAGNOSIS:  Phimosis, desires sterility.   POSTOPERATIVE DIAGNOSIS:  Phimosis, desires sterility.   PROCEDURE:  1. Circumcision.  2. Bilateral vasectomy.   SURGEON:  Sigmund I. Patsi Sears, M.D.   ASSISTANT:  Terie Purser, MD.   ANESTHESIA:  General.   COMPLICATIONS:  None.   BLOOD LOSS:  Minimal.   SPECIMEN:  Bilateral vasa differentia.   DISPOSITION:  Stable to post anesthesia care unit.   INDICATIONS:  [name] is a 50 year old male with a history of  phimosis.  He also desires sterility.  He is brought to the operating  room today for circumcision and vasectomy.  Benefits and risks were  explained, consent was obtained.   DESCRIPTION OF PROCEDURE:  The patient was brought to the operating room  and properly identified.  Time-out was performed and identified correct  patient and procedure.  He was administered general anesthesia and given  preoperative antibiotic; then placed in the supine position and prepped  and draped in sterile fashion.  We marked the penile skin in a  circumferential fashion.  Bilateral circumcising incisions were made.  Skin was removed in a sleeve technique.  Bleeding points were  controlled.  The skin was then reapproximated in an interrupted fashion  using 3-0 Monocryl suture.   We then proceeded with bilateral vasectomy.  The right vas was  identified.  A small skin incision was made.  The vas was dissected free  using the sharp dissector, and then grasped with the clamp.  Surrounding  tissue was freed up.  The vas was then doubly clamped and divided.  A  portion was sent to  pathology.  Both edges were cauterized.  Both ends  were tied using a 2-0 Vicryl suture.  Local anesthetic was then injected  in the skin incision as well as in the sheath.  Both ends were then  tucked separately back to the scrotum and the wound was closed in 2  layers using 2-0  Vicryl suture.  Identical technique was performed on the left side.  There were no complications.  The patient was awakened from anesthesia  and transported to the recovery room in stable condition.  Dr.  Patsi Sears was present and participated in all aspects of this  procedures, as he was primary surgeon.           ______________________________  Terie Purser, MD     JH/MEDQ  D:  07/31/2006  T:  07/31/2006  Job:  [id number]

## 2011-02-06 ENCOUNTER — Telehealth: Payer: Self-pay

## 2011-02-06 MED ORDER — SIMVASTATIN 10 MG PO TABS: 10.0000 mg | ORAL_TABLET | Freq: Every day | ORAL | Status: DC

## 2011-02-06 NOTE — Telephone Encounter (Signed)
rx sent into pharmacy

## 2011-02-09 ENCOUNTER — Other Ambulatory Visit: Payer: Self-pay | Admitting: Family Medicine

## 2011-02-12 LAB — CBC
HCT: 36.6 — ABNORMAL LOW
Hemoglobin: 12.3 — ABNORMAL LOW
MCV: 80.2
Platelets: 278
RDW: 13.6

## 2011-02-12 LAB — BASIC METABOLIC PANEL
BUN: 12
Chloride: 107
Glucose, Bld: 103 — ABNORMAL HIGH
Potassium: 3.7
Sodium: 141

## 2011-04-28 ENCOUNTER — Other Ambulatory Visit: Payer: Self-pay | Admitting: Family Medicine

## 2011-08-11 ENCOUNTER — Other Ambulatory Visit: Payer: Self-pay | Admitting: Occupational Medicine

## 2011-08-11 ENCOUNTER — Ambulatory Visit: Payer: Self-pay

## 2011-08-11 DIAGNOSIS — R52 Pain, unspecified: Secondary | ICD-10-CM

## 2011-08-17 ENCOUNTER — Other Ambulatory Visit: Payer: Self-pay | Admitting: Family Medicine

## 2013-09-27 ENCOUNTER — Encounter: Payer: Self-pay | Admitting: Family Medicine

## 2013-09-27 ENCOUNTER — Ambulatory Visit (INDEPENDENT_AMBULATORY_CARE_PROVIDER_SITE_OTHER): Payer: 59 | Admitting: Family Medicine

## 2013-09-27 ENCOUNTER — Encounter: Payer: Self-pay | Admitting: Internal Medicine

## 2013-09-27 VITALS — BP 140/80 | Temp 98.8°F | Ht 73.0 in | Wt 232.0 lb

## 2013-09-27 DIAGNOSIS — Z0000 Encounter for general adult medical examination without abnormal findings: Secondary | ICD-10-CM

## 2013-09-27 DIAGNOSIS — E785 Hyperlipidemia, unspecified: Secondary | ICD-10-CM

## 2013-09-27 DIAGNOSIS — E079 Disorder of thyroid, unspecified: Secondary | ICD-10-CM

## 2013-09-27 NOTE — Patient Instructions (Signed)
Continue good health habits  We will get you set up for a screening colonoscopy  Eye exam Dr. Hillis Range  Send a copy of your insurance labs to me for review

## 2013-09-27 NOTE — Progress Notes (Signed)
Pre visit review using our clinic review tool, if applicable. No additional management support is needed unless otherwise documented below in the visit note. 

## 2013-09-27 NOTE — Progress Notes (Signed)
   Subjective:    Patient ID: [name], male    DOB: December 15, 1960, 53 y.o.   MRN: [id number]  HPI Is a 53 year old male nonsmoker who comes in today for general physical examination not having been here in the year so.  We do not have him on Zocor 10 mg daily for hyperlipidemia. He's been more conscious of his diet stop the Zocor we'll check a lipid level  It does not get routine eye care refer to Dr. Bing Plume, does get regular dental care, due for his first screening colonoscopy  Tetanus booster 2011  He also has a history of an abnormal TSH level that we'll need to be followed up.   Review of Systems  Constitutional: Negative.   HENT: Negative.   Eyes: Negative.   Respiratory: Negative.   Cardiovascular: Negative.   Gastrointestinal: Negative.   Genitourinary: Negative.   Musculoskeletal: Negative.   Skin: Negative.   Neurological: Negative.   Psychiatric/Behavioral: Negative.        Objective:   Physical Exam  Nursing note and vitals reviewed. Constitutional: He is oriented to person, place, and time. He appears well-developed and well-nourished.  HENT:  Head: Normocephalic and atraumatic.  Right Ear: External ear normal.  Left Ear: External ear normal.  Nose: Nose normal.  Mouth/Throat: Oropharynx is clear and moist.  Eyes: Conjunctivae and EOM are normal. Pupils are equal, round, and reactive to light.  Neck: Normal range of motion. Neck supple. No JVD present. No tracheal deviation present. No thyromegaly present.  Cardiovascular: Normal rate, regular rhythm, normal heart sounds and intact distal pulses.  Exam reveals no gallop and no friction rub.   No murmur heard. Pulmonary/Chest: Effort normal and breath sounds normal. No stridor. No respiratory distress. He has no wheezes. He has no rales. He exhibits no tenderness.  Abdominal: Soft. Bowel sounds are normal. He exhibits no distension and no mass. There is no tenderness. There is no rebound and no guarding.    Genitourinary: Rectum normal, prostate normal and penis normal. Guaiac negative stool. No penile tenderness.  Musculoskeletal: Normal range of motion. He exhibits no edema and no tenderness.  Lymphadenopathy:    He has no cervical adenopathy.  Neurological: He is alert and oriented to person, place, and time. He has normal reflexes. No cranial nerve deficit. He exhibits normal muscle tone.  Skin: Skin is warm and dry. No rash noted. No erythema. No pallor.  For seborrheic sinus back size and a scar from previous lumbar disc surgery  Psychiatric: He has a normal mood and affect. His behavior is normal. Judgment and thought content normal.          Assessment & Plan:  Healthy male  History of kidney stones x2,,,,,,

## 2013-09-29 ENCOUNTER — Telehealth: Payer: Self-pay | Admitting: Family Medicine

## 2013-09-29 DIAGNOSIS — E785 Hyperlipidemia, unspecified: Secondary | ICD-10-CM

## 2013-09-29 DIAGNOSIS — J309 Allergic rhinitis, unspecified: Secondary | ICD-10-CM

## 2013-09-29 DIAGNOSIS — Z0000 Encounter for general adult medical examination without abnormal findings: Secondary | ICD-10-CM

## 2013-09-29 NOTE — Telephone Encounter (Signed)
Pt was seen on Tuesday and dr. Sherren Mocha had informed him to bring a copy of his previous labs, pt states that the results is not available from his insurance company to get a copy. Pt wants to know if dr. Sherren Mocha wants him to come in for labs if so to place the order.

## 2013-09-29 NOTE — Telephone Encounter (Signed)
Dr Maudie Mercury is now the PCP.  Would you like labs for this patient?

## 2013-09-29 NOTE — Telephone Encounter (Signed)
I have never seen this patient. I prefer that PCP designation not be changed until patient has seen me to establish care and that orders, refills come from current PCP until he sees me. I advise him to schedule new pt visit with me or if Dr. Sherren Mocha wants to do labs since he saw him for his physical and is still here he can. Up to Dr/ Sherren Mocha. Thanks.

## 2013-09-30 NOTE — Telephone Encounter (Signed)
Left message on machine for patient to call and schedule a lab appointment.  Orders placed.

## 2013-10-04 ENCOUNTER — Other Ambulatory Visit (INDEPENDENT_AMBULATORY_CARE_PROVIDER_SITE_OTHER): Payer: 59

## 2013-10-04 DIAGNOSIS — J309 Allergic rhinitis, unspecified: Secondary | ICD-10-CM

## 2013-10-04 DIAGNOSIS — Z0000 Encounter for general adult medical examination without abnormal findings: Secondary | ICD-10-CM

## 2013-10-04 DIAGNOSIS — E785 Hyperlipidemia, unspecified: Secondary | ICD-10-CM

## 2013-10-04 LAB — POCT URINALYSIS DIPSTICK
Bilirubin, UA: NEGATIVE
Blood, UA: NEGATIVE
Glucose, UA: NEGATIVE
Ketones, UA: NEGATIVE
LEUKOCYTES UA: NEGATIVE
Nitrite, UA: NEGATIVE
Spec Grav, UA: 1.02
Urobilinogen, UA: 1
pH, UA: 7.5

## 2013-10-04 LAB — CBC WITH DIFFERENTIAL/PLATELET
Basophils Absolute: 0 10*3/uL (ref 0.0–0.1)
Basophils Relative: 1 % (ref 0.0–3.0)
EOS ABS: 0.1 10*3/uL (ref 0.0–0.7)
Eosinophils Relative: 1.3 % (ref 0.0–5.0)
HEMATOCRIT: 39.4 % (ref 39.0–52.0)
Hemoglobin: 13.4 g/dL (ref 13.0–17.0)
Lymphocytes Relative: 39.9 % (ref 12.0–46.0)
Lymphs Abs: 2 10*3/uL (ref 0.7–4.0)
MCHC: 33.9 g/dL (ref 30.0–36.0)
MCV: 89.2 fl (ref 78.0–100.0)
Monocytes Absolute: 0.6 10*3/uL (ref 0.1–1.0)
Monocytes Relative: 11.6 % (ref 3.0–12.0)
NEUTROS PCT: 46.2 % (ref 43.0–77.0)
Neutro Abs: 2.3 10*3/uL (ref 1.4–7.7)
PLATELETS: 220 10*3/uL (ref 150.0–400.0)
RBC: 4.42 Mil/uL (ref 4.22–5.81)
RDW: 13.3 % (ref 11.5–15.5)
WBC: 5.1 10*3/uL (ref 4.0–10.5)

## 2013-10-04 LAB — TSH: TSH: 0.01 u[IU]/mL — ABNORMAL LOW (ref 0.35–4.50)

## 2013-10-04 LAB — LIPID PANEL
CHOL/HDL RATIO: 5
Cholesterol: 168 mg/dL (ref 0–200)
HDL: 35.6 mg/dL — ABNORMAL LOW (ref >39.00)
LDL Cholesterol: 115 mg/dL — ABNORMAL HIGH (ref 0–99)
NONHDL: 132.4
TRIGLYCERIDES: 86 mg/dL (ref 0.0–149.0)
VLDL: 17.2 mg/dL (ref 0.0–40.0)

## 2013-10-04 LAB — BASIC METABOLIC PANEL
BUN: 16 mg/dL (ref 6–23)
CALCIUM: 9.2 mg/dL (ref 8.4–10.5)
CO2: 26 mEq/L (ref 19–32)
Chloride: 105 mEq/L (ref 96–112)
Creatinine, Ser: 0.9 mg/dL (ref 0.4–1.5)
GFR: 89.08 mL/min (ref >60.00)
GLUCOSE: 79 mg/dL (ref 70–99)
Potassium: 4.1 mEq/L (ref 3.5–5.1)
SODIUM: 138 meq/L (ref 135–145)

## 2013-10-04 LAB — PSA: PSA: 1.89 ng/mL (ref 0.10–4.00)

## 2013-10-04 LAB — HEPATIC FUNCTION PANEL
ALT: 19 U/L (ref 0–53)
AST: 20 U/L (ref 0–37)
Albumin: 3.8 g/dL (ref 3.5–5.2)
Alkaline Phosphatase: 67 U/L (ref 39–117)
BILIRUBIN TOTAL: 0.9 mg/dL (ref 0.2–1.2)
Bilirubin, Direct: 0.2 mg/dL (ref 0.0–0.3)
Total Protein: 6.4 g/dL (ref 6.0–8.3)

## 2013-11-24 ENCOUNTER — Encounter: Payer: Self-pay | Admitting: Gastroenterology

## 2013-12-12 ENCOUNTER — Encounter: Payer: Self-pay | Admitting: Internal Medicine

## 2014-01-17 ENCOUNTER — Ambulatory Visit (AMBULATORY_SURGERY_CENTER): Payer: Self-pay | Admitting: *Deleted

## 2014-01-17 VITALS — Ht 73.0 in | Wt 236.0 lb

## 2014-01-17 DIAGNOSIS — Z1211 Encounter for screening for malignant neoplasm of colon: Secondary | ICD-10-CM

## 2014-01-17 MED ORDER — NA SULFATE-K SULFATE-MG SULF 17.5-3.13-1.6 GM/177ML PO SOLN: ORAL | Status: DC

## 2014-01-17 NOTE — Progress Notes (Signed)
No allergies to eggs or soy. No problems with anesthesia.  Pt given Emmi instructions for colonoscopy  No oxygen use  No diet drug use  

## 2014-01-23 ENCOUNTER — Encounter: Payer: Self-pay | Admitting: Gastroenterology

## 2014-02-01 ENCOUNTER — Encounter: Payer: Self-pay | Admitting: Gastroenterology

## 2014-02-01 ENCOUNTER — Ambulatory Visit (AMBULATORY_SURGERY_CENTER): Payer: 59 | Admitting: Gastroenterology

## 2014-02-01 VITALS — BP 120/75 | HR 56 | Temp 96.6°F | Resp 26 | Ht 73.0 in | Wt 236.0 lb

## 2014-02-01 DIAGNOSIS — D123 Benign neoplasm of transverse colon: Secondary | ICD-10-CM

## 2014-02-01 DIAGNOSIS — Z1211 Encounter for screening for malignant neoplasm of colon: Secondary | ICD-10-CM

## 2014-02-01 DIAGNOSIS — K648 Other hemorrhoids: Secondary | ICD-10-CM

## 2014-02-01 MED ORDER — SODIUM CHLORIDE 0.9 % IV SOLN: 500.0000 mL | INTRAVENOUS | Status: DC

## 2014-02-01 NOTE — Op Note (Signed)
[place]  [address]. [place], [id number]   COLONOSCOPY PROCEDURE REPORT  PATIENT: [name], [name]  MR#: [id number] BIRTHDATE: 1960-12-09 , 36  yrs. old GENDER: male ENDOSCOPIST: Inda Castle, MD REFERRED BY: PROCEDURE DATE:  02/01/2014 PROCEDURE:   Colonoscopy with snare polypectomy First Screening Colonoscopy - Avg.  risk and is 50 yrs.  old or older Yes.  Prior Negative Screening - Now for repeat screening. N/A  History of Adenoma - Now for follow-up colonoscopy & has been > or = to 3 yrs.  N/A  Polyps Removed Today? Yes. ASA CLASS:   Class II INDICATIONS:average risk for colon cancer. MEDICATIONS: Monitored anesthesia care and Propofol 250 mg IV  DESCRIPTION OF PROCEDURE:   After the risks benefits and alternatives of the procedure were thoroughly explained, informed consent was obtained.  The digital rectal exam revealed no abnormalities of the rectum.   The LB [id number]  endoscope was introduced through the anus and advanced to the cecum, which was identified by both the appendix and ileocecal valve. No adverse events experienced.   The quality of the prep was excellent using Suprep  The instrument was then slowly withdrawn as the colon was fully examined.      COLON FINDINGS: Internal hemorrhoids were found.   A semi-pedunculated polyp measuring 10 mm in size was found in the distal transverse colon.  A polypectomy was performed using snare cautery.  The resection was complete, the polyp tissue was completely retrieved and sent to histology.   The examined terminal ileum appeared to be normal.   The examination was otherwise normal.  Retroflexed views revealed no abnormalities. The time to cecum=1 minutes 50 seconds.  Withdrawal time=10 minutes 17 seconds. The scope was withdrawn and the procedure completed. COMPLICATIONS: There were no immediate complications.  ENDOSCOPIC IMPRESSION: 1.   Internal hemorrhoids 2.    Semi-pedunculated polyp measuring 10 mm in size was found in the distal transverse colon; polypectomy was performed using snare cautery 3.   The examined terminal ileum appeared to be normal 4.   The examination was otherwise normal  RECOMMENDATIONS: If the polyp(s) removed today are proven to be adenomatous (pre-cancerous) polyps, you will need a colonoscopy in 3 years. Otherwise you should continue to follow colorectal cancer screening guidelines for "routine risk" patients with a colonoscopy in 10 years.  You will receive a letter within 1-2 weeks with the results of your biopsy as well as final recommendations.  Please call my office if you have not received a letter after 3 weeks.  eSigned:  Inda Castle, MD 02/01/2014 9:52 AM   cc:   PATIENT NAME:  [name], [name] MR#: [id number]

## 2014-02-01 NOTE — Progress Notes (Signed)
Called to room to assist during endoscopic procedure.  Patient ID and intended procedure confirmed with present staff. Received instructions for my participation in the procedure from the performing physician.  

## 2014-02-01 NOTE — Progress Notes (Signed)
A/ox3 pleased with MAC, report to Annette RN 

## 2014-02-01 NOTE — Patient Instructions (Signed)

## 2014-02-01 NOTE — Progress Notes (Signed)
No problems noted in the recovery room. maw 

## 2014-02-02 ENCOUNTER — Telehealth: Payer: Self-pay | Admitting: *Deleted

## 2014-02-02 NOTE — Telephone Encounter (Signed)
  Follow up Call-  Call back number 02/01/2014  Post procedure Call Back phone  # [phone]  Permission to leave phone message Yes     Patient questions:  Do you have a fever, pain , or abdominal swelling? No. Pain Score  0 *  Have you tolerated food without any problems? Yes.    Have you been able to return to your normal activities? Yes.    Do you have any questions about your discharge instructions: Diet   No. Medications  No. Follow up visit  No.  Do you have questions or concerns about your Care? No.  Actions: * If pain score is 4 or above: No action needed, pain <4.

## 2014-02-06 ENCOUNTER — Encounter: Payer: Self-pay | Admitting: Gastroenterology

## 2015-05-30 ENCOUNTER — Encounter: Payer: Self-pay | Admitting: Family Medicine

## 2015-05-30 ENCOUNTER — Ambulatory Visit (INDEPENDENT_AMBULATORY_CARE_PROVIDER_SITE_OTHER): Payer: Commercial Managed Care - HMO | Admitting: Family Medicine

## 2015-05-30 VITALS — BP 140/90 | Temp 98.4°F | Wt 242.5 lb

## 2015-05-30 DIAGNOSIS — Z0000 Encounter for general adult medical examination without abnormal findings: Secondary | ICD-10-CM

## 2015-05-30 DIAGNOSIS — M722 Plantar fascial fibromatosis: Secondary | ICD-10-CM

## 2015-05-30 NOTE — Progress Notes (Signed)
Pre visit review using our clinic review tool, if applicable. No additional management support is needed unless otherwise documented below in the visit note. 

## 2015-05-30 NOTE — Progress Notes (Signed)
   Subjective:    Patient ID: [name], male    DOB: [birth year], 55 y.o.   MRN: [id number]  HPI Cody is a 55 year old male married nonsmoker who comes in today for evaluation of right foot pain  He does pain in his right heel last spring. He's been treating it symptomatically however it's getting worse. He's tried ice anti-inflammatory stretching nothing seems to help   Review of Systems No history of trauma    Objective:   Physical Exam  Well-developed well-developed well-nourished male no acute distress vital signs stable he is afebrile examination of foot shows skin to be normal pulses normal there is tenderness in the plantar fascia      Assessment & Plan:  Plantar fasciitis............ consult with [name] at [facility]

## 2015-05-30 NOTE — Patient Instructions (Addendum)
Motrin 400 mg twice daily  Continue the stretching  Continue the ice  We'll get you set up a consult with [name] at [facility] orthopedic outpatient physical therapy for evaluation and treatment  Set up a time the spring for general physical examination  Fasting labs one week prior

## 2015-06-06 ENCOUNTER — Encounter: Payer: Self-pay | Admitting: Family Medicine

## 2015-06-28 ENCOUNTER — Encounter: Payer: Self-pay | Admitting: Family Medicine

## 2015-08-14 ENCOUNTER — Telehealth: Payer: Self-pay | Admitting: Family Medicine

## 2015-08-14 DIAGNOSIS — Z0000 Encounter for general adult medical examination without abnormal findings: Secondary | ICD-10-CM

## 2015-08-14 DIAGNOSIS — E785 Hyperlipidemia, unspecified: Secondary | ICD-10-CM

## 2015-08-14 NOTE — Telephone Encounter (Signed)
Pt had to [facility]sc cpx labs and order expired in  July. [name] please put new order in . Pt will come in nov 2017 for cpx labs

## 2015-08-15 NOTE — Telephone Encounter (Signed)
Labs orders placed

## 2015-10-09 ENCOUNTER — Other Ambulatory Visit: Payer: Self-pay

## 2015-10-16 ENCOUNTER — Encounter: Payer: Self-pay | Admitting: Family Medicine

## 2016-03-04 ENCOUNTER — Other Ambulatory Visit (INDEPENDENT_AMBULATORY_CARE_PROVIDER_SITE_OTHER): Payer: Commercial Managed Care - HMO

## 2016-03-04 DIAGNOSIS — Z0000 Encounter for general adult medical examination without abnormal findings: Secondary | ICD-10-CM | POA: Diagnosis not present

## 2016-03-04 DIAGNOSIS — E785 Hyperlipidemia, unspecified: Secondary | ICD-10-CM | POA: Diagnosis not present

## 2016-03-04 LAB — POC URINALSYSI DIPSTICK (AUTOMATED)
GLUCOSE UA: NEGATIVE
Ketones, UA: NEGATIVE
Leukocytes, UA: NEGATIVE
Nitrite, UA: NEGATIVE
RBC UA: NEGATIVE
SPEC GRAV UA: 1.02
Urobilinogen, UA: 2
pH, UA: 7

## 2016-03-04 LAB — LIPID PANEL
CHOL/HDL RATIO: 4
Cholesterol: 147 mg/dL (ref 0–200)
HDL: 34.4 mg/dL — AB (ref >39.00)
LDL CALC: 94 mg/dL (ref 0–99)
NonHDL: 112.67
TRIGLYCERIDES: 93 mg/dL (ref 0.0–149.0)
VLDL: 18.6 mg/dL (ref 0.0–40.0)

## 2016-03-04 LAB — BASIC METABOLIC PANEL
BUN: 15 mg/dL (ref 6–23)
CALCIUM: 9.5 mg/dL (ref 8.4–10.5)
CHLORIDE: 107 meq/L (ref 96–112)
CO2: 28 meq/L (ref 19–32)
CREATININE: 0.91 mg/dL (ref 0.40–1.50)
GFR: 91.66 mL/min (ref >60.00)
GLUCOSE: 91 mg/dL (ref 70–99)
Potassium: 4.3 mEq/L (ref 3.5–5.1)
Sodium: 141 mEq/L (ref 135–145)

## 2016-03-04 LAB — HEPATIC FUNCTION PANEL
ALT: 16 U/L (ref 0–53)
AST: 15 U/L (ref 0–37)
Albumin: 4.2 g/dL (ref 3.5–5.2)
Alkaline Phosphatase: 74 U/L (ref 39–117)
Bilirubin, Direct: 0.2 mg/dL (ref 0.0–0.3)
TOTAL PROTEIN: 6.7 g/dL (ref 6.0–8.3)
Total Bilirubin: 0.9 mg/dL (ref 0.2–1.2)

## 2016-03-04 LAB — CBC WITH DIFFERENTIAL/PLATELET
BASOS PCT: 0.8 % (ref 0.0–3.0)
Basophils Absolute: 0 10*3/uL (ref 0.0–0.1)
EOS ABS: 0.1 10*3/uL (ref 0.0–0.7)
Eosinophils Relative: 1.8 % (ref 0.0–5.0)
HCT: 43.7 % (ref 39.0–52.0)
HEMOGLOBIN: 14.9 g/dL (ref 13.0–17.0)
LYMPHS ABS: 1.8 10*3/uL (ref 0.7–4.0)
Lymphocytes Relative: 37.8 % (ref 12.0–46.0)
MCHC: 34.1 g/dL (ref 30.0–36.0)
MCV: 89.1 fl (ref 78.0–100.0)
MONO ABS: 0.6 10*3/uL (ref 0.1–1.0)
Monocytes Relative: 12.3 % — ABNORMAL HIGH (ref 3.0–12.0)
NEUTROS PCT: 47.3 % (ref 43.0–77.0)
Neutro Abs: 2.3 10*3/uL (ref 1.4–7.7)
Platelets: 221 10*3/uL (ref 150.0–400.0)
RBC: 4.91 Mil/uL (ref 4.22–5.81)
RDW: 13 % (ref 11.5–15.5)
WBC: 4.8 10*3/uL (ref 4.0–10.5)

## 2016-03-04 LAB — PSA: PSA: 1.75 ng/mL (ref 0.10–4.00)

## 2016-03-04 LAB — TSH: TSH: 0 u[IU]/mL — AB (ref 0.35–4.50)

## 2016-03-18 ENCOUNTER — Encounter: Payer: Self-pay | Admitting: Family Medicine

## 2016-03-18 ENCOUNTER — Ambulatory Visit (INDEPENDENT_AMBULATORY_CARE_PROVIDER_SITE_OTHER): Payer: Commercial Managed Care - HMO | Admitting: Family Medicine

## 2016-03-18 VITALS — HR 75 | Temp 98.6°F | Ht 73.5 in | Wt 233.0 lb

## 2016-03-18 DIAGNOSIS — Z0001 Encounter for general adult medical examination with abnormal findings: Secondary | ICD-10-CM

## 2016-03-18 DIAGNOSIS — M1612 Unilateral primary osteoarthritis, left hip: Secondary | ICD-10-CM

## 2016-03-18 DIAGNOSIS — E079 Disorder of thyroid, unspecified: Secondary | ICD-10-CM

## 2016-03-18 DIAGNOSIS — Z0000 Encounter for general adult medical examination without abnormal findings: Secondary | ICD-10-CM

## 2016-03-18 DIAGNOSIS — R1310 Dysphagia, unspecified: Secondary | ICD-10-CM | POA: Diagnosis not present

## 2016-03-18 DIAGNOSIS — E785 Hyperlipidemia, unspecified: Secondary | ICD-10-CM

## 2016-03-18 LAB — T3, FREE: T3 FREE: 5.4 pg/mL — AB (ref 2.3–4.2)

## 2016-03-18 LAB — T4, FREE: FREE T4: 1.21 ng/dL (ref 0.60–1.60)

## 2016-03-18 LAB — TSH: TSH: 0.01 u[IU]/mL — AB (ref 0.35–4.50)

## 2016-03-18 NOTE — Patient Instructions (Signed)
Let's repeat your thyroid studies. I'll call you the report next week  We will also get you set up for scan of your thyroid gland to see if that might be the reason you having difficulty swallowing.  Continue the Dr. Valentino Nose  free diet!!!!!!!!!!!!!!!!!!!!!!........  Follow-up a week after you thyroid scan

## 2016-03-18 NOTE — Progress Notes (Signed)
[name] is a 55 year old divorce male nonsmoker,,,,, lives at home with his 21 year old son whose in middle school at [place],,, retired Engineer, structural, who comes in today for general physical examination  He has a history of plantar fasciitis. We send him to see [name] last year. After couple sessions he was referred to Dr. Alfonso Sparks who did an injection of cortisone. It's better but not well.  He's also on Mobitz 15 mg daily. Being on the Mobic he says his hands feel better.  He's noticed stiffness and soreness in his hands for about 2 years. No redness or swelling. No other joints bother him. The Mobic again does help.  We send him to GI couple years ago because of dysphagia. Workup showed no evidence of any obstruction. The dysphasia now comes and goes as it has before. It doesn't seem to be any particular type of food or liquid. Sometimes water even sets it off. He wonders if it could be a problem with his thyroid. Interesting enough his TSH level 0.00. It was low in the past repeat was back to normal. This is probably a lab error.  He gets routine eye care, dental care, colonoscopy 2015 showed a premalignant polyp. He's due to go back next year for follow-up colonoscopy.  Weight is down 10 pounds over the last 6 weeks cozy stop drinking Dr. Malachi Sparks. He's class I obese with a BMI 32.00. Encouraged to continue diet exercise and weight loss.  Labs done 2 weeks ago showed a normal blood sugar cholesterol normal thyroid normal PSA 1.75. His father was diagnosed of prostate cancer in his late 38s early 51s.  Vaccinations up-to-date he has seasonal flu shot at [employer] [id number]. Tetanus booster 2011.  14 point review of systems negative except for above  Physical evaluation.vs Pulse 75   Temp 98.6 F (37 C)   Ht 6' 1.5" (1.867 m)   Wt 233 lb (105.7 kg)   SpO2 97%   BMI 30.32 kg/m  Examination of the HEENT were negative except the right lobe of the thyroid was  slightly enlarged. Left lobe normal. Neck was supple no adenopathy cardiopulmonary exam normal abdominal exam normal genitalia normal circumcised male rectum normal stool guaiac-negative prostate normal extremities normal skin normal peripheral pulses normal.  Impression #1 healthy male  #2 plantar fasciitis resolving slowly  #3 persistent dysphasia with slight enlargement of the right lobe of the thyroid gland.......... set up for scan and uptake.  #4 osteoarthritis.........Marland Kitchen Motrin 400 mg twice a day with food

## 2016-03-19 ENCOUNTER — Other Ambulatory Visit: Payer: Self-pay | Admitting: Family Medicine

## 2016-03-19 DIAGNOSIS — R7989 Other specified abnormal findings of blood chemistry: Secondary | ICD-10-CM

## 2016-04-03 ENCOUNTER — Ambulatory Visit
Admission: RE | Admit: 2016-04-03 | Discharge: 2016-04-03 | Disposition: A | Discharge status: Home / Self Care | Payer: Commercial Managed Care - HMO | Source: Ambulatory Visit | Attending: Family Medicine | Admitting: Family Medicine

## 2016-04-03 DIAGNOSIS — R1310 Dysphagia, unspecified: Secondary | ICD-10-CM

## 2016-04-03 DIAGNOSIS — E079 Disorder of thyroid, unspecified: Secondary | ICD-10-CM

## 2016-04-07 ENCOUNTER — Other Ambulatory Visit: Payer: Self-pay | Admitting: Emergency Medicine

## 2016-04-07 DIAGNOSIS — E079 Disorder of thyroid, unspecified: Secondary | ICD-10-CM

## 2016-04-07 DIAGNOSIS — E0789 Other specified disorders of thyroid: Secondary | ICD-10-CM

## 2016-04-22 ENCOUNTER — Ambulatory Visit: Payer: Self-pay | Admitting: Internal Medicine

## 2016-04-25 ENCOUNTER — Other Ambulatory Visit: Payer: Self-pay | Admitting: Family Medicine

## 2016-04-25 ENCOUNTER — Other Ambulatory Visit: Payer: Self-pay | Admitting: Emergency Medicine

## 2016-04-25 ENCOUNTER — Telehealth: Payer: Self-pay | Admitting: Family Medicine

## 2016-04-25 DIAGNOSIS — R1310 Dysphagia, unspecified: Secondary | ICD-10-CM

## 2016-05-01 ENCOUNTER — Encounter: Payer: Self-pay | Admitting: Family Medicine

## 2016-05-01 ENCOUNTER — Other Ambulatory Visit: Payer: Self-pay | Admitting: Emergency Medicine

## 2016-05-01 DIAGNOSIS — R1310 Dysphagia, unspecified: Secondary | ICD-10-CM

## 2016-05-01 NOTE — Telephone Encounter (Signed)
Dr. Sherren Mocha per chart pt has not been informed of ultrasound results.

## 2016-05-06 ENCOUNTER — Other Ambulatory Visit: Payer: Self-pay | Admitting: Family Medicine

## 2016-05-06 ENCOUNTER — Other Ambulatory Visit: Payer: Self-pay

## 2016-05-06 ENCOUNTER — Other Ambulatory Visit: Payer: Self-pay | Admitting: Emergency Medicine

## 2016-05-06 DIAGNOSIS — R7989 Other specified abnormal findings of blood chemistry: Secondary | ICD-10-CM

## 2016-05-06 DIAGNOSIS — E0789 Other specified disorders of thyroid: Secondary | ICD-10-CM

## 2016-05-06 DIAGNOSIS — E041 Nontoxic single thyroid nodule: Secondary | ICD-10-CM

## 2016-05-06 DIAGNOSIS — E079 Disorder of thyroid, unspecified: Secondary | ICD-10-CM

## 2016-05-07 ENCOUNTER — Other Ambulatory Visit (HOSPITAL_COMMUNITY)
Admission: RE | Admit: 2016-05-07 | Discharge: 2016-05-07 | Disposition: A | Discharge status: Home / Self Care | Payer: Commercial Managed Care - HMO | Source: Ambulatory Visit | Attending: Radiology | Admitting: Radiology

## 2016-05-07 ENCOUNTER — Ambulatory Visit
Admission: RE | Admit: 2016-05-07 | Discharge: 2016-05-07 | Disposition: A | Discharge status: Home / Self Care | Payer: Commercial Managed Care - HMO | Source: Ambulatory Visit | Attending: Family Medicine | Admitting: Family Medicine

## 2016-05-07 DIAGNOSIS — E041 Nontoxic single thyroid nodule: Secondary | ICD-10-CM | POA: Insufficient documentation

## 2016-05-07 DIAGNOSIS — R7989 Other specified abnormal findings of blood chemistry: Secondary | ICD-10-CM

## 2016-05-12 ENCOUNTER — Other Ambulatory Visit: Payer: Self-pay | Admitting: Emergency Medicine

## 2016-05-12 DIAGNOSIS — R1310 Dysphagia, unspecified: Secondary | ICD-10-CM

## 2016-05-12 NOTE — Progress Notes (Signed)
Please set up an ENT consult with Dr. Melissa Montane  at [facility]. And call patient and let him know that we will be setting this up. He may need to have that lesion removed surgically even though its benign because it's causing obstructive symptoms

## 2016-05-12 NOTE — Progress Notes (Signed)
Pt is still having problems swallowing. Sometimes pt is having to cough food back.

## 2016-05-12 NOTE — Telephone Encounter (Signed)
Referral sent in to ENT

## 2016-05-19 ENCOUNTER — Encounter: Payer: Self-pay | Admitting: Family Medicine

## 2016-05-26 ENCOUNTER — Ambulatory Visit: Payer: Self-pay | Admitting: Internal Medicine

## 2016-05-28 DIAGNOSIS — K219 Gastro-esophageal reflux disease without esophagitis: Secondary | ICD-10-CM | POA: Insufficient documentation

## 2016-05-28 DIAGNOSIS — R1314 Dysphagia, pharyngoesophageal phase: Secondary | ICD-10-CM | POA: Insufficient documentation

## 2016-05-28 DIAGNOSIS — E041 Nontoxic single thyroid nodule: Secondary | ICD-10-CM | POA: Diagnosis not present

## 2016-07-02 NOTE — Telephone Encounter (Signed)
error 

## 2017-01-16 ENCOUNTER — Encounter: Payer: Self-pay | Admitting: Family Medicine

## 2017-02-04 DIAGNOSIS — Z23 Encounter for immunization: Secondary | ICD-10-CM | POA: Diagnosis not present

## 2017-03-02 ENCOUNTER — Encounter: Payer: Self-pay | Admitting: Gastroenterology

## 2017-06-11 DIAGNOSIS — M546 Pain in thoracic spine: Secondary | ICD-10-CM | POA: Diagnosis not present

## 2017-06-11 DIAGNOSIS — M47816 Spondylosis without myelopathy or radiculopathy, lumbar region: Secondary | ICD-10-CM | POA: Diagnosis not present

## 2017-06-11 DIAGNOSIS — M48061 Spinal stenosis, lumbar region without neurogenic claudication: Secondary | ICD-10-CM | POA: Diagnosis not present

## 2017-06-11 DIAGNOSIS — M549 Dorsalgia, unspecified: Secondary | ICD-10-CM | POA: Diagnosis not present

## 2017-06-11 DIAGNOSIS — M5416 Radiculopathy, lumbar region: Secondary | ICD-10-CM | POA: Diagnosis not present

## 2017-06-11 DIAGNOSIS — M5136 Other intervertebral disc degeneration, lumbar region: Secondary | ICD-10-CM | POA: Diagnosis not present

## 2017-06-29 DIAGNOSIS — Z9889 Other specified postprocedural states: Secondary | ICD-10-CM | POA: Diagnosis not present

## 2017-06-29 DIAGNOSIS — M5416 Radiculopathy, lumbar region: Secondary | ICD-10-CM | POA: Diagnosis not present

## 2017-06-29 DIAGNOSIS — M47816 Spondylosis without myelopathy or radiculopathy, lumbar region: Secondary | ICD-10-CM | POA: Diagnosis not present

## 2017-07-09 DIAGNOSIS — M5136 Other intervertebral disc degeneration, lumbar region: Secondary | ICD-10-CM | POA: Diagnosis not present

## 2017-07-09 DIAGNOSIS — M48061 Spinal stenosis, lumbar region without neurogenic claudication: Secondary | ICD-10-CM | POA: Diagnosis not present

## 2017-07-09 DIAGNOSIS — M4726 Other spondylosis with radiculopathy, lumbar region: Secondary | ICD-10-CM | POA: Diagnosis not present

## 2017-08-28 DIAGNOSIS — M5136 Other intervertebral disc degeneration, lumbar region: Secondary | ICD-10-CM | POA: Diagnosis not present

## 2017-08-28 DIAGNOSIS — Z9889 Other specified postprocedural states: Secondary | ICD-10-CM | POA: Diagnosis not present

## 2017-08-28 DIAGNOSIS — M4155 Other secondary scoliosis, thoracolumbar region: Secondary | ICD-10-CM | POA: Diagnosis not present

## 2018-03-02 DIAGNOSIS — M4155 Other secondary scoliosis, thoracolumbar region: Secondary | ICD-10-CM | POA: Diagnosis not present

## 2018-03-02 DIAGNOSIS — Z9889 Other specified postprocedural states: Secondary | ICD-10-CM | POA: Diagnosis not present

## 2018-03-02 DIAGNOSIS — M5136 Other intervertebral disc degeneration, lumbar region: Secondary | ICD-10-CM | POA: Diagnosis not present

## 2021-07-03 ENCOUNTER — Emergency Department
Admission: EM | Admit: 2021-07-03 | Discharge: 2021-07-03 | Disposition: A | Discharge status: Home / Self Care | Payer: 59 | Source: Home / Self Care | Attending: Family Medicine | Admitting: Family Medicine

## 2021-07-03 ENCOUNTER — Other Ambulatory Visit: Payer: Self-pay

## 2021-07-03 DIAGNOSIS — H9201 Otalgia, right ear: Secondary | ICD-10-CM

## 2021-07-03 DIAGNOSIS — R030 Elevated blood-pressure reading, without diagnosis of hypertension: Secondary | ICD-10-CM | POA: Diagnosis not present

## 2021-07-03 MED ORDER — FLUTICASONE PROPIONATE 50 MCG/ACT NA SUSP: 2.0000 | Freq: Every day | NASAL | 0 refills | Status: DC

## 2021-07-03 MED ORDER — PREDNISONE 20 MG PO TABS: 20.0000 mg | ORAL_TABLET | Freq: Two times a day (BID) | ORAL | 0 refills | Status: DC

## 2021-07-03 NOTE — ED Triage Notes (Addendum)
Pt here today c/o RT ear discomfort and sore throat for several weeks. Mucinex prn. Denies fever. Currently taking amoxicillin TID for dental work. Pt hypertensive today. Keeps log at home and usually runs in 130s pet pt. ?

## 2021-07-03 NOTE — ED Provider Notes (Signed)
?[place] ? ? ? ?CSN: [id number] ?Arrival date & time: 07/03/21  1057 ? ? ?  ? ?History   ?Chief Complaint ?Chief Complaint  ?Patient presents with  ? Ear Problem  ?  RT  ? Sore Throat  ? ? ?HPI ?[name] is a 61 y.o. male.  ? ?HPI ?Patient has had discomfort in his right ear and on the right side of his throat for about 10 weeks.  He states it feels more like an irritation than a real painful sore throat.  He feels it when he swallows.  He feels some discomfort when he turns his head. ?He does have a history of [employer] ?He has a history of a thyroid nodule ?He does not have a PCP ?Discussed elevated BP ? ?Past Medical History:  ?Diagnosis Date  ? Allergy   ? Arthritis   ? Hyperlipidemia   ? ? ?Patient Active Problem List  ? Diagnosis Date Noted  ? Plantar fasciitis, right 05/30/2015  ? Routine general medical examination at a health care facility 09/27/2013  ? NEVUS, MELANOCYTIC, FACE 08/31/2009  ? 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 Disorder of thyroid 08/23/2009  ? OTHER SPECIFIED DISORDER OF MALE GENITAL ORGANS 08/23/2009  ? RENAL CALCULUS, RIGHT 11/26/2008  ? ALLERGIC RHINITIS 01/19/2007  ? ? ?Past Surgical History:  ?Procedure Laterality Date  ? BACK SURGERY  2003, 2004  ? ? ? ? ? ?Home Medications   ? ?Prior to Admission medications   ?Medication Sig Start Date End Date Taking? Authorizing Provider  ?fluticasone (FLONASE) 50 MCG/ACT nasal spray Place 2 sprays into both nostrils daily. 07/03/21  Yes Raylene Everts, MD  ?predniSONE (DELTASONE) 20 MG tablet Take 1 tablet (20 mg total) by mouth 2 (two) times daily with a meal. 07/03/21  Yes Raylene Everts, MD  ?amoxicillin (AMOXIL) 500 MG tablet Take by mouth. 06/26/21   [provider]  ?cetirizine (ZYRTEC) 10 MG tablet Take 10 mg by mouth daily.    [provider]  ? ? ?Family History ?Family History  ?Problem Relation Age of Onset  ? Dementia Mother   ? Colon cancer Neg Hx   ? ? ?Social History ?Social History  ? ?Tobacco Use  ?  Smoking status: Never  ? Smokeless tobacco: Never  ?Substance Use Topics  ? Alcohol use: Yes  ?  Comment: rare  ? Drug use: No  ? ? ? ?Allergies   ?Celecoxib ? ? ?Review of Systems ?Review of Systems ?See HPI ? ?Physical Exam ?Triage Vital Signs ?ED Triage Vitals  ?Enc Vitals Group  ?   BP 07/03/21 1110 (!) 168/102  ?   Pulse Rate 07/03/21 1110 89  ?   Resp 07/03/21 1110 18  ?   Temp 07/03/21 1110 98.3 ?F (36.8 ?C)  ?   Temp Source 07/03/21 1110 Oral  ?   SpO2 07/03/21 1110 98 %  ?   Weight --   ?   Height --   ?   Head Circumference --   ?   Peak Flow --   ?   Pain Score 07/03/21 1111 0  ?   Pain Loc --   ?   Pain Edu? --   ?   Excl. in [place]? --   ? ?No data found. ? ?Updated Vital Signs ?BP (!) 168/102 (BP Location: Right Arm)   Pulse 89   Temp 98.3 ?F (36.8 ?C) (Oral)   Resp 18   SpO2 98%  ? ?   ? ?  Physical Exam ?Constitutional:   ?   General: He is not in acute distress. ?   Appearance: He is well-developed.  ?HENT:  ?   Head: Normocephalic and atraumatic.  ?   Right Ear: Tympanic membrane and ear canal normal. No drainage, swelling or tenderness. No middle ear effusion. Tympanic membrane is not erythematous.  ?   Left Ear: Tympanic membrane and ear canal normal. No drainage, swelling or tenderness.  No middle ear effusion. Tympanic membrane is not erythematous.  ?   Nose: No congestion.  ?   Mouth/Throat:  ?   Mouth: Mucous membranes are moist.  ?   Comments: Ear exam is normal ?No nasal congestion noted ?Posterior pharynx difficult exam, uvula surgically absent, tonsils as well, scarring present.  No abnormality or asymmetry identified ?Eyes:  ?   Conjunctiva/sclera: Conjunctivae normal.  ?   Pupils: Pupils are equal, round, and reactive to light.  ?Neck:  ?   Thyroid: No thyromegaly.  ?   Comments: Patient has history of thyroid nodule.  No mass or adenopathy appreciated ?Cardiovascular:  ?   Rate and Rhythm: Normal rate and regular rhythm.  ?   Heart sounds: Normal heart sounds.  ?Pulmonary:  ?   Effort:  Pulmonary effort is normal. No respiratory distress.  ?   Breath sounds: Normal breath sounds.  ?Abdominal:  ?   General: There is no distension.  ?   Palpations: Abdomen is soft.  ?Musculoskeletal:     ?   General: Normal range of motion.  ?   Cervical back: Normal range of motion.  ?Lymphadenopathy:  ?   Cervical: No cervical adenopathy.  ?Skin: ?   General: Skin is warm and dry.  ?Neurological:  ?   Mental Status: He is alert.  ? ? ? ?UC Treatments / Results  ?Labs ?(all labs ordered are listed, but only abnormal results are displayed) ?Labs Reviewed - No data to display ? ?EKG ? ? ?Radiology ?No results found. ? ?Procedures ?Procedures (including critical care time) ? ?Medications Ordered in UC ?Medications - No data to display ? ?Initial Impression / Assessment and Plan / UC Course  ?I have reviewed the triage vital signs and the nursing notes. ? ?Pertinent labs & imaging results that were available during my care of the patient were reviewed by me and considered in my medical decision making (see chart for details). ? ?  ? ?I explained the patient I cannot do a complete examination of the ENT and urgent care office.  He should go back to Dr. Constance Holster for evaluation.  He may need additional thyroid studies.  I cannot explain with confidence is 10 weeks of ear pain ?Blood pressure discussed ?Follow-up PCP and ENT encouraged ?Final Clinical Impressions(s) / UC Diagnoses  ? ?Final diagnoses:  ?Otalgia of right ear  ?Elevated blood pressure reading  ? ? ? ?Discharge Instructions   ? ?  ?Make sure you drink plenty of water ?May continue the cetirizine allergy tablet daily ?Take prednisone 2 times a day for 5 days ?Use Flonase daily ?If your symptoms do not improve over the next week or 2, I recommend follow-up with Dr. Arville Care ?You have seen him previously, he is associated with [place] ear nose and throat ? ?You need to follow-up on your blood pressure.  It is elevated today. ? ? ?ED  Prescriptions   ? ? Medication Sig Dispense Auth. Provider  ? predniSONE (DELTASONE) 20 MG tablet Take 1 tablet (  20 mg total) by mouth 2 (two) times daily with a meal. 10 tablet Raylene Everts, MD  ? fluticasone [facility]) 50 MCG/ACT nasal spray Place 2 sprays into both nostrils daily. 16 g Raylene Everts, MD  ? ?  ? ?PDMP not reviewed this encounter. ?  ?Raylene Everts, MD ?07/03/21 1146 ? ?

## 2021-07-03 NOTE — Discharge Instructions (Signed)
Make sure you drink plenty of water ?May continue the cetirizine allergy tablet daily ?Take prednisone 2 times a day for 5 days ?Use Flonase daily ?If your symptoms do not improve over the next week or 2, I recommend follow-up with Dr. Arville Care ?You have seen him previously, he is associated with [place] ear nose and throat ? ?You need to follow-up on your blood pressure.  It is elevated today. ?

## 2021-08-29 ENCOUNTER — Other Ambulatory Visit: Payer: Self-pay | Admitting: Otolaryngology

## 2021-08-29 DIAGNOSIS — H9201 Otalgia, right ear: Secondary | ICD-10-CM | POA: Insufficient documentation

## 2021-08-29 DIAGNOSIS — J029 Acute pharyngitis, unspecified: Secondary | ICD-10-CM

## 2021-09-24 ENCOUNTER — Ambulatory Visit
Admission: RE | Admit: 2021-09-24 | Discharge: 2021-09-24 | Disposition: A | Discharge status: Home / Self Care | Payer: 59 | Source: Ambulatory Visit | Attending: Otolaryngology | Admitting: Otolaryngology

## 2021-09-24 DIAGNOSIS — J029 Acute pharyngitis, unspecified: Secondary | ICD-10-CM

## 2021-09-24 MED ORDER — IOPAMIDOL (ISOVUE-300) INJECTION 61%
75.0000 mL | Freq: Once | INTRAVENOUS | Status: AC | PRN
  Administered 2021-09-24: 75 mL via INTRAVENOUS

## 2021-10-30 ENCOUNTER — Encounter: Payer: Self-pay | Admitting: *Deleted

## 2021-10-31 NOTE — Progress Notes (Signed)
[facility]  PATIENT: [name] DOB: July 25, 1960  REFERRING CLINICIAN: Izora Gala, MD HISTORY FROM: self REASON FOR VISIT: headache, ear and throat pain   HISTORICAL  CHIEF COMPLAINT:  Chief Complaint  Patient presents with   RM 2    Patient is here alone for right sided head pain. Pain location involves ear, and constant headache on the right side. He states he doesn't have any teeth/jaw pain. He can brush his teeth fine and shave fine.    HISTORY OF PRESENT ILLNESS:  The patient presents for evaluation of right sided headache, ear, and throat pain which began in November 2022. He had 2 teeth removed in January 2023 and was given amoxicillin, which did not alleviate his symptoms. He was then given a steroid spray but continued to have pain so he was referred to ENT. They ordered a CT soft tissue of the neck, which did not reveal a cause for his symptoms.  Currently he has a constant low grade pain in the entire right side of his head. Pain is worst in his right ear and neck with superimposed sharp/stabbing pain which radiates down his throat. This pain lasts for 3-4 seconds at a time. There are no triggers for the pain but he does feel pressure in his throat when he swallows and will occasionally get food caught in his throat. Does note some lacrimation of his right eye but otherwise no ipsilateral autonomic symptoms. Denies pain in the face and can brush his teeth and shave without issues.  OTHER MEDICAL CONDITIONS: none   REVIEW OF SYSTEMS: Full 14 system review of systems performed and negative with exception of: ear pain, throat pain  ALLERGIES: Allergies  Allergen Reactions   Celecoxib     REACTION: GI bleed    HOME MEDICATIONS: Outpatient Medications Prior to Visit  Medication Sig Dispense Refill   cetirizine (ZYRTEC) 10 MG tablet Take 10 mg by mouth daily.     amoxicillin (AMOXIL) 500 MG tablet Take by mouth.     fluticasone (FLONASE) 50  MCG/ACT nasal spray Place 2 sprays into both nostrils daily. 16 g 0   predniSONE (DELTASONE) 20 MG tablet Take 1 tablet (20 mg total) by mouth 2 (two) times daily with a meal. 10 tablet 0   No facility-administered medications prior to visit.    PAST MEDICAL HISTORY: Past Medical History:  Diagnosis Date   Allergy    Arthritis    Hyperlipidemia     PAST SURGICAL HISTORY: Past Surgical History:  Procedure Laterality Date   BACK SURGERY  2003, 2004   NASAL SINUS SURGERY     TONSILLECTOMY      FAMILY HISTORY: Family History  Problem Relation Age of Onset   Dementia Mother    Colon cancer Neg Hx     SOCIAL HISTORY: Social History   Socioeconomic History   Marital status: Divorced    Spouse name: Not on file   Number of children: 1   Years of education: Not on file   Highest education level: Not on file  Occupational History   Not on file  Tobacco Use   Smoking status: Never   Smokeless tobacco: Never  Substance and Sexual Activity   Alcohol use: Not Currently    Comment: rare, none since 2018   Drug use: No   Sexual activity: Not on file  Other Topics Concern   Not on file  Social History Narrative   Lives alone, son stays with him  sometimes    Right handed   Caffeine: soda 3-4 (maybe 16 oz each) per day   Social Determinants of Health   Financial Resource Strain: Not on file  Food Insecurity: Not on file  Transportation Needs: Not on file  Physical Activity: Not on file  Stress: Not on file  Social Connections: Not on file  Intimate Partner Violence: Not on file     PHYSICAL EXAM   GENERAL EXAM/CONSTITUTIONAL: Vitals:  Vitals:   11/01/21 1047  BP: 130/78  Pulse: 80  Weight: 256 lb (116.1 kg)  Height: '6\' 1"'  (1.854 m)   Body mass index is 33.78 kg/m. Wt Readings from Last 3 Encounters:  11/01/21 256 lb (116.1 kg)  03/18/16 233 lb (105.7 kg)  05/30/15 242 lb 8 oz (110 kg)   NEUROLOGIC: MENTAL STATUS:  awake, alert, oriented to person,  place and time recent and remote memory intact normal attention and concentration   CRANIAL NERVE:  2nd, 3rd, 4th, 6th - pupils equal and reactive to light, visual fields full to confrontation, extraocular muscles intact, no nystagmus 5th - facial sensation symmetric 7th - facial strength symmetric 8th - hearing intact 9th - palate elevates symmetrically, uvula midline 11th - shoulder shrug symmetric 12th - tongue protrusion midline  MOTOR:  normal bulk and tone, full strength in the BUE, BLE  SENSORY:  normal and symmetric to light touch all 4 extremities  COORDINATION:  finger-nose-finger intact bilaterally  REFLEXES:  deep tendon reflexes present and symmetric  GAIT/STATION:  normal     DIAGNOSTIC DATA (LABS, IMAGING, TESTING) - I reviewed patient records, labs, notes, testing and imaging myself where available.  Lab Results  Component Value Date   WBC 4.8 03/04/2016   HGB 14.9 03/04/2016   HCT 43.7 03/04/2016   MCV 89.1 03/04/2016   PLT 221.0 03/04/2016      Component Value Date/Time   NA 141 03/04/2016 0804   K 4.3 03/04/2016 0804   CL 107 03/04/2016 0804   CO2 28 03/04/2016 0804   GLUCOSE 91 03/04/2016 0804   BUN 15 03/04/2016 0804   CREATININE 0.91 03/04/2016 0804   CALCIUM 9.5 03/04/2016 0804   PROT 6.7 03/04/2016 0804   ALBUMIN 4.2 03/04/2016 0804   AST 15 03/04/2016 0804   ALT 16 03/04/2016 0804   ALKPHOS 74 03/04/2016 0804   BILITOT 0.9 03/04/2016 0804   GFRNONAA 95.21 08/15/2009 0912   GFRAA  10/26/2006 1750    >60        The eGFR has been calculated using the MDRD equation. This calculation has not been validated in all clinical   Lab Results  Component Value Date   CHOL 147 03/04/2016   HDL 34.40 (L) 03/04/2016   LDLCALC 94 03/04/2016   TRIG 93.0 03/04/2016   CHOLHDL 4 03/04/2016   No results found for: "HGBA1C" No results found for: "VITAMINB12" Lab Results  Component Value Date   TSH 0.01 (L) 03/18/2016   CT soft tissue  neck 09/24/21: 1. No specific cause for symptoms. 2. Right thyroid nodule which was biopsied in 2018. No growth when comparing to sonography dimensions.   ASSESSMENT AND PLAN  61 y.o. year old male without significant medical history who presents for evaluation of right sided headaches, ear, and throat pain. 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 CT soft tissue of the neck was unremarkable other than for stable right thyroid nodule. Will order MRI/MRA of the brain to assess for structural causes of his pain  including vascular compression of the glossopharyngeal nerve. He would prefer to avoid medications at this time.   1. Glossopharyngeal neuralgia       PLAN: -MRI brain, MRA head -Will follow up after testing  Orders Placed This Encounter  Procedures   MR BRAIN W WO CONTRAST   MR ANGIO HEAD WO CONTRAST    No orders of the defined types were placed in this encounter.   Return in about 6 months (around 05/04/2022).    Genia Harold, MD 11/01/21 11:54 AM  I spent an average of 33 minutes chart reviewing and counseling the patient, with at least 50% of the time face to face with the patient.   [facility] [address] [place], [place] [address] [phone]

## 2021-11-01 ENCOUNTER — Ambulatory Visit: Payer: 59 | Admitting: Psychiatry

## 2021-11-01 ENCOUNTER — Telehealth: Payer: Self-pay | Admitting: Psychiatry

## 2021-11-01 ENCOUNTER — Encounter: Payer: Self-pay | Admitting: Psychiatry

## 2021-11-01 VITALS — BP 130/78 | HR 80 | Ht 73.0 in | Wt 256.0 lb

## 2021-11-01 DIAGNOSIS — G521 Disorders of glossopharyngeal nerve: Secondary | ICD-10-CM | POA: Diagnosis not present

## 2021-11-01 NOTE — Telephone Encounter (Signed)
75 mins MR brain w/wo & MRA head wo Dr. Billey Gosling [facility] NPR case #[id number] and #[id number] scheduled at [facility] 11/05/21 at 10am

## 2021-11-01 NOTE — Patient Instructions (Addendum)
MRI and MRA of the brain  Glossopharyngeal neuralgia

## 2021-11-05 ENCOUNTER — Ambulatory Visit: Payer: 59

## 2021-11-05 DIAGNOSIS — G521 Disorders of glossopharyngeal nerve: Secondary | ICD-10-CM

## 2021-11-05 MED ORDER — GADOBENATE DIMEGLUMINE 529 MG/ML IV SOLN
20.0000 mL | Freq: Once | INTRAVENOUS | Status: AC | PRN
  Administered 2021-11-05: 20 mL via INTRAVENOUS

## 2021-11-12 ENCOUNTER — Encounter: Payer: Self-pay | Admitting: Psychiatry

## 2021-11-12 ENCOUNTER — Other Ambulatory Visit: Payer: Self-pay | Admitting: Psychiatry

## 2021-11-12 DIAGNOSIS — Z5181 Encounter for therapeutic drug level monitoring: Secondary | ICD-10-CM

## 2021-11-12 MED ORDER — OXCARBAZEPINE 300 MG PO TABS: 300.0000 mg | ORAL_TABLET | Freq: Two times a day (BID) | ORAL | 6 refills | Status: DC

## 2021-11-13 ENCOUNTER — Other Ambulatory Visit (INDEPENDENT_AMBULATORY_CARE_PROVIDER_SITE_OTHER): Payer: Self-pay

## 2021-11-13 ENCOUNTER — Encounter: Payer: Self-pay | Admitting: Neurology

## 2021-11-13 DIAGNOSIS — Z5181 Encounter for therapeutic drug level monitoring: Secondary | ICD-10-CM

## 2021-11-13 DIAGNOSIS — Z0289 Encounter for other administrative examinations: Secondary | ICD-10-CM

## 2021-11-14 LAB — CBC WITH DIFFERENTIAL/PLATELET
Basophils Absolute: 0 10*3/uL (ref 0.0–0.2)
Basos: 1 %
EOS (ABSOLUTE): 0.1 10*3/uL (ref 0.0–0.4)
Eos: 1 %
Hematocrit: 44 % (ref 37.5–51.0)
Hemoglobin: 14.9 g/dL (ref 13.0–17.7)
Immature Grans (Abs): 0 10*3/uL (ref 0.0–0.1)
Immature Granulocytes: 0 %
Lymphocytes Absolute: 1.9 10*3/uL (ref 0.7–3.1)
Lymphs: 43 %
MCH: 29.6 pg (ref 26.6–33.0)
MCHC: 33.9 g/dL (ref 31.5–35.7)
MCV: 88 fL (ref 79–97)
Monocytes Absolute: 0.6 10*3/uL (ref 0.1–0.9)
Monocytes: 13 %
Neutrophils Absolute: 1.9 10*3/uL (ref 1.4–7.0)
Neutrophils: 42 %
Platelets: 170 10*3/uL (ref 150–450)
RBC: 5.03 x10E6/uL (ref 4.14–5.80)
RDW: 12.7 % (ref 11.6–15.4)
WBC: 4.5 10*3/uL (ref 3.4–10.8)

## 2021-11-14 LAB — COMPREHENSIVE METABOLIC PANEL
ALT: 14 IU/L (ref 0–44)
AST: 19 IU/L (ref 0–40)
Albumin/Globulin Ratio: 2 (ref 1.2–2.2)
Albumin: 4.5 g/dL (ref 3.9–4.9)
Alkaline Phosphatase: 100 IU/L (ref 44–121)
BUN/Creatinine Ratio: 11 (ref 10–24)
BUN: 10 mg/dL (ref 8–27)
Bilirubin Total: 0.5 mg/dL (ref 0.0–1.2)
CO2: 21 mmol/L (ref 20–29)
Calcium: 9.2 mg/dL (ref 8.6–10.2)
Chloride: 106 mmol/L (ref 96–106)
Creatinine, Ser: 0.87 mg/dL (ref 0.76–1.27)
Globulin, Total: 2.2 g/dL (ref 1.5–4.5)
Glucose: 127 mg/dL — ABNORMAL HIGH (ref 70–99)
Potassium: 3.9 mmol/L (ref 3.5–5.2)
Sodium: 144 mmol/L (ref 134–144)
Total Protein: 6.7 g/dL (ref 6.0–8.5)
eGFR: 98 mL/min/{1.73_m2} (ref >59)

## 2022-02-12 ENCOUNTER — Ambulatory Visit: Payer: 59 | Admitting: Medical-Surgical

## 2022-02-12 ENCOUNTER — Encounter: Payer: Self-pay | Admitting: Medical-Surgical

## 2022-02-12 VITALS — BP 121/78 | HR 83 | Resp 20 | Ht 73.0 in | Wt 261.1 lb

## 2022-02-12 DIAGNOSIS — Z23 Encounter for immunization: Secondary | ICD-10-CM

## 2022-02-12 DIAGNOSIS — Z7689 Persons encountering health services in other specified circumstances: Secondary | ICD-10-CM

## 2022-02-12 DIAGNOSIS — G521 Disorders of glossopharyngeal nerve: Secondary | ICD-10-CM

## 2022-02-12 DIAGNOSIS — L989 Disorder of the skin and subcutaneous tissue, unspecified: Secondary | ICD-10-CM

## 2022-02-12 NOTE — Progress Notes (Signed)
New Patient Office Visit  Subjective:  Patient ID: [name], male    DOB: 04-19-1961  Age: 61 y.o. MRN: [id number]  CC:  Chief Complaint  Patient presents with   Establish Care   HPI [name] presents to establish care.  He is a very pleasant 61 year old male who has recently been under the care of ENT and neurology.  Notes that it has been several years since he had a primary care provider and had a physical which prompted him to schedule his appointment.  Reports that over the last year, he has had a headache on the right side of his head along with right neck and right ear pain.  He was seen by urgent care who referred him to ENT.  ENT did a CT which was normal.  He was then referred to neurology who did both an MRI and an MRA with normal findings.  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  He took this medication for approximately 2 months and did not notice a difference in his symptoms so he stopped the medication.  He did note that he had some orthostasis with position changes while on the medication but this is fully resolved.  He continues to have pain along the neck from just below the ear posterior to the jaw and extending down.  Notes that the pain is manageable however he wants to know what is wrong so it can be fixed.  Notes that he is not a big fan of taking medications and he would prefer to have something that we will fix the problem rather than be on maintenance medications for symptom management for the rest of his life.  About 2 months ago he noticed a brown lesion on his back.  It is in the middle of his thoracic spine area so he had difficulty seeing it.  It is asymptomatic but he is concerned that it may need further evaluation.  Unclear if there have been any changes in the size, color, shape, border, etc.  Past Medical History:  Diagnosis Date   Allergy     Arthritis    Hyperlipidemia     Past Surgical History:  Procedure Laterality Date   BACK SURGERY  2003, 2004   HERNIA REPAIR  2003 & 2004   DISC LOWER BACK   NASAL SINUS SURGERY     TONSILLECTOMY      Family History  Problem Relation Age of Onset   Dementia Mother    Colon cancer Neg Hx     Social History   Socioeconomic History   Marital status: Divorced    Spouse name: Not on file   Number of children: 1   Years of education: Not on file   Highest education level: Not on file  Occupational History   Not on file  Tobacco Use   Smoking status: Never   Smokeless tobacco: Never  Substance and Sexual Activity   Alcohol use: Not Currently    Comment: rare, none since 2018   Drug use: No   Sexual activity: Yes  Other Topics Concern   Not on file  Social History Narrative   Lives alone, son stays with him sometimes    Right handed   Caffeine: soda 3-4 (maybe 16 oz each) per day   Social Determinants of Health   Financial Resource Strain: Not on file  Food Insecurity: Not on file  Transportation Needs: Not on file  Physical Activity: Not on file  Stress: Not on file  Social Connections: Not on file  Intimate Partner Violence: Not on file    ROS Review of Systems  Constitutional:  Negative for chills, fatigue, fever and unexpected weight change.  HENT:  Negative for congestion, rhinorrhea, sinus pressure and sore throat.   Eyes:  Negative for visual disturbance.  Respiratory:  Negative for cough, chest tightness, shortness of breath and wheezing.   Cardiovascular:  Negative for chest pain, palpitations and leg swelling.  Gastrointestinal:  Negative for abdominal pain, constipation, diarrhea, nausea and vomiting.  Genitourinary:  Negative for dysuria, frequency and urgency.  Skin:  Negative for rash.  Neurological:  Negative for dizziness, light-headedness and headaches.  Psychiatric/Behavioral:  Negative for dysphoric mood, self-injury, sleep disturbance and  suicidal ideas. The patient is not nervous/anxious.     Objective:   Today's Vitals: BP 121/78   Pulse 83   Resp 20   Ht '6\' 1"'$  (1.854 m)   Wt 261 lb 1.6 oz (118.4 kg)   SpO2 97%   BMI 34.45 kg/m   Physical Exam Vitals and nursing note reviewed.  Constitutional:      General: He is not in acute distress.    Appearance: Normal appearance. He is obese. He is not ill-appearing.  HENT:     Head: Normocephalic and atraumatic.     Right Ear: Tympanic membrane, ear canal and external ear normal. There is no impacted cerumen.     Left Ear: Tympanic membrane, ear canal and external ear normal. There is no impacted cerumen.     Nose: Nose normal.     Mouth/Throat:     Mouth: Mucous membranes are moist.     Pharynx: No oropharyngeal exudate or posterior oropharyngeal erythema.  Eyes:     General: No scleral icterus.       Right eye: No discharge.        Left eye: No discharge.     Extraocular Movements: Extraocular movements intact.     Conjunctiva/sclera: Conjunctivae normal.     Pupils: Pupils are equal, round, and reactive to light.  Neck:     Comments: Tenderness to palpation and a slight "ropey" feel just posterior to the mandible and below the right ear Cardiovascular:     Rate and Rhythm: Normal rate and regular rhythm.     Pulses: Normal pulses.     Heart sounds: Normal heart sounds. No murmur heard.    No friction rub. No gallop.  Pulmonary:     Effort: Pulmonary effort is normal. No respiratory distress.     Breath sounds: Normal breath sounds.  Musculoskeletal:     Cervical back: Neck supple.  Lymphadenopathy:     Cervical: No cervical adenopathy.  Skin:    General: Skin is warm and dry.  Neurological:     Mental Status: He is alert and oriented to person, place, and time.  Psychiatric:        Mood and Affect: Mood normal.        Behavior: Behavior normal.        Thought Content: Thought content normal.        Judgment: Judgment normal.    Assessment & Plan:    1. Encounter to establish care Reviewed available information and discussed care concerns with patient.   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  This was made by neurology and his symptoms do not match however the  medication they prescribed did not make a difference and he would like to avoid medications for symptom management.  He is interested in finding a fix for his symptoms.  We did discuss that some healthcare issues are not fully understood and therefore a fix may not be a realistic expectation.  Will look more into potential causes for his symptoms and let him know about potential findings.  3. Skin lesion Lesion on his back with benign appearance, solid in color, no irregular borders.  We did discuss the option for shave biopsy and he reports he would like to schedule an appointment for this to make sure that the lesion is nothing concerning.  4. Need for shingles vaccine Shingrix No. 1 given in office today.  Next shingles vaccine will be due in 2-6 months.  Okay to schedule an nurse visit for this. - Zoster Recombinant (Shingrix )   Outpatient Encounter Medications as of 02/12/2022  Medication Sig   cetirizine (ZYRTEC) 10 MG tablet Take 10 mg by mouth daily.   [DISCONTINUED] Oxcarbazepine (TRILEPTAL) 300 MG tablet Take 1 tablet (300 mg total) by mouth 2 (two) times daily. (Patient not taking: Reported on 02/12/2022)   No facility-administered encounter medications on file as of 02/12/2022.   Follow-up: Return for annual physical exam at your convenience.   Clearnce Sorrel, DNP, APRN, FNP-BC [facility]

## 2022-03-03 ENCOUNTER — Other Ambulatory Visit: Payer: Self-pay

## 2022-03-03 ENCOUNTER — Telehealth: Payer: Self-pay | Admitting: Psychiatry

## 2022-03-03 DIAGNOSIS — Z0000 Encounter for general adult medical examination without abnormal findings: Secondary | ICD-10-CM

## 2022-03-03 LAB — LIPID PANEL
LDL Cholesterol (Calc): 106 mg/dL (calc) — ABNORMAL HIGH
Non-HDL Cholesterol (Calc): 130 mg/dL (calc) — ABNORMAL HIGH (ref <130)

## 2022-03-03 LAB — COMPLETE METABOLIC PANEL WITH GFR
AG Ratio: 1.8 (calc) (ref 1.0–2.5)
ALT: 11 U/L (ref 9–46)
Albumin: 4.4 g/dL (ref 3.6–5.1)
Alkaline phosphatase (APISO): 85 U/L (ref 35–144)
BUN: 12 mg/dL (ref 7–25)
CO2: 29 mmol/L (ref 20–32)
Calcium: 9.4 mg/dL (ref 8.6–10.3)
Creat: 0.83 mg/dL (ref 0.70–1.35)
Glucose, Bld: 85 mg/dL (ref 65–99)
Total Bilirubin: 0.7 mg/dL (ref 0.2–1.2)
Total Protein: 6.8 g/dL (ref 6.1–8.1)

## 2022-03-03 NOTE — Telephone Encounter (Signed)
LVM and sent mychart msg informing pt of need to reschedule 05/21/22 appointment - MD out

## 2022-03-04 LAB — CBC WITH DIFFERENTIAL/PLATELET
Eosinophils Absolute: 90 cells/uL (ref 15–500)
Eosinophils Relative: 1.6 %
HCT: 42.6 % (ref 38.5–50.0)
Hemoglobin: 14.9 g/dL (ref 13.2–17.1)

## 2022-03-04 LAB — HEMOGLOBIN A1C: Hgb A1c MFr Bld: 4.9 % of total Hgb (ref <5.7)

## 2022-03-04 LAB — LIPID PANEL: Cholesterol: 167 mg/dL (ref <200)

## 2022-03-04 LAB — COMPLETE METABOLIC PANEL WITH GFR: Sodium: 141 mmol/L (ref 135–146)

## 2022-03-06 LAB — CBC WITH DIFFERENTIAL/PLATELET
Absolute Monocytes: 711 cells/uL (ref 200–950)
Basophils Absolute: 50 cells/uL (ref 0–200)
Basophils Relative: 0.9 %
Lymphs Abs: 2010 cells/uL (ref 850–3900)
MCH: 30.5 pg (ref 27.0–33.0)
MCHC: 35 g/dL (ref 32.0–36.0)
MCV: 87.1 fL (ref 80.0–100.0)
MPV: 10.4 fL (ref 7.5–12.5)
Monocytes Relative: 12.7 %
Neutro Abs: 2738 cells/uL (ref 1500–7800)
Neutrophils Relative %: 48.9 %
Platelets: 204 10*3/uL (ref 140–400)
RBC: 4.89 10*6/uL (ref 4.20–5.80)
RDW: 13 % (ref 11.0–15.0)
Total Lymphocyte: 35.9 %
WBC: 5.6 10*3/uL (ref 3.8–10.8)

## 2022-03-06 LAB — HEMOGLOBIN A1C
Mean Plasma Glucose: 94 mg/dL
eAG (mmol/L): 5.2 mmol/L

## 2022-03-06 LAB — LIPID PANEL
HDL: 37 mg/dL — ABNORMAL LOW (ref >=40)
Total CHOL/HDL Ratio: 4.5 (calc) (ref <5.0)
Triglycerides: 126 mg/dL (ref <150)

## 2022-03-06 LAB — COMPLETE METABOLIC PANEL WITH GFR
AST: 13 U/L (ref 10–35)
Chloride: 105 mmol/L (ref 98–110)
Globulin: 2.4 g/dL (calc) (ref 1.9–3.7)
Potassium: 5.1 mmol/L (ref 3.5–5.3)
eGFR: 100 mL/min/{1.73_m2} (ref >=60)

## 2022-03-06 LAB — T4, FREE: Free T4: 1.4 ng/dL (ref 0.8–1.8)

## 2022-03-06 LAB — TSH: TSH: <0.01 mIU/L — ABNORMAL LOW (ref 0.40–4.50)

## 2022-03-07 ENCOUNTER — Ambulatory Visit: Payer: 59 | Admitting: Medical-Surgical

## 2022-03-09 NOTE — Progress Notes (Unsigned)
HPI: [name] is a 61 y.o. male who  has a past medical history of Allergy, Arthritis, and Hyperlipidemia.  he presents to [facility] today, 03/10/22,  for chief complaint of: Annual physical exam  Dentist: UTD, q 6 months Eye exam: none in years, no correction outside of reading glasses Exercise: walking at work Diet: Eats all for groups, room for improvement Colon cancer screening: done in 2015, 10 year follow Prostate cancer screening: not today COVID vaccine:   Concerns: Continued issues with pressure on the right side of his neck and right ear. Still have difficulty swallowing several times weekly. Notes a history of esophageal dilation but reports this did not help with his swallowing.  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 08/23/2009   RENAL CALCULUS, RIGHT 11/26/2008   ALLERGIC RHINITIS 01/19/2007    Past Surgical History:  Procedure Laterality Date   BACK SURGERY  2003, 2004   HERNIA REPAIR  2003 & 2004   [place] LOWER BACK   NASAL SINUS SURGERY     TONSILLECTOMY      Social History   Tobacco Use   Smoking status: Never   Smokeless tobacco: Never  Substance Use Topics   Alcohol use: Not Currently    Comment: rare, none since 2018    Family History  Problem Relation Age of Onset   Dementia Mother    Colon cancer Neg Hx      Current medication list and allergy/intolerance information reviewed:    Current Outpatient Medications  Medication Sig  Dispense Refill   cetirizine (ZYRTEC) 10 MG tablet Take 10 mg by mouth daily.     No current facility-administered medications for this visit.   Allergies  Allergen Reactions   Celecoxib     REACTION: GI bleed      Review of Systems: Constitutional:  No  fever, no chills, No recent illness, No unintentional weight changes. No significant fatigue.  HEENT: No  headache, no vision change, no hearing change, No sore throat, No  sinus pressure, + right neck/ear discomfort Cardiac: No  chest pain, No  pressure, No palpitations, No  Orthopnea Respiratory:  No  shortness of breath. No  Cough Gastrointestinal: No  abdominal pain, No  nausea, No  vomiting,  No  blood in stool, No  diarrhea, No  constipation, + intermittent dysphagia Musculoskeletal: No new myalgia/arthralgia Skin: No  Rash, No other wounds/concerning lesions Genitourinary: No  incontinence, No  abnormal genital bleeding, No abnormal genital discharge Hem/Onc: No  easy bruising/bleeding, No  abnormal lymph node Endocrine: No cold intolerance,  No heat intolerance. No polyuria/polydipsia/polyphagia  Neurologic: No  weakness, No  dizziness, No  slurred speech/focal weakness/facial droop Psychiatric: No  concerns with depression, No  concerns with anxiety, No sleep problems, No mood problems  Exam:  BP 136/81 (BP Location: Left Arm, Cuff Size: Large)   Pulse 76   Resp 20   Ht '6\' 1"'$  (1.854 m)   Wt 259 lb 8 oz (117.7 kg)   SpO2 100%  BMI 34.24 kg/m  Constitutional: VS see above. General Appearance: alert, well-developed, well-nourished, NAD Eyes: Normal lids and conjunctive, non-icteric sclera Ears, Nose, Mouth, Throat: MMM, Normal external inspection ears/nares/mouth/lips/gums. TM normal bilaterally. Pharynx/tonsils no erythema, no exudate. Nasal mucosa normal.  Neck: No masses, trachea midline. No thyroid enlargement. No tenderness/mass appreciated. No lymphadenopathy Respiratory: Normal respiratory effort. no wheeze, no  rhonchi, no rales Cardiovascular: S1/S2 normal, no murmur, no rub/gallop auscultated. RRR. No lower extremity edema. Pedal pulse II/IV bilaterally DP and PT. No carotid bruit or JVD. No abdominal aortic bruit. Gastrointestinal: Nontender, no masses. No hepatomegaly, no splenomegaly. No hernia appreciated. Bowel sounds normal. Rectal exam deferred.  Musculoskeletal: Gait normal. No clubbing/cyanosis of digits.  Neurological: Normal balance/coordination. No tremor. No cranial nerve deficit on limited exam. Motor and sensation intact and symmetric. Cerebellar reflexes intact.  Skin: warm, dry, intact. No rash/ulcer. + large round lesion in the center of his back planned for excision at next visit Psychiatric: Normal judgment/insight. Normal mood and affect. Oriented x3.    ASSESSMENT/PLAN:   1. Annual physical exam Labs already collected.  Reviewed results available in [facility].  Up-to-date on preventative care.  Recommend updating eye exam.  Wellness information provided with AVS.  2. . Disorder of thyroid Referring to endocrinology for further evaluation.  He currently feels that the nodule on his thyroid is contributing to his right neck/ear symptoms and dysphagia.  He would like to have endocrinology weigh in on this and discuss his low TSH levels for the past few years. - Ambulatory referral to Endocrinology  3. Encounter for screening for HIV 4. Need for hepatitis C screening test Discussed screening recommendations.  Patient is agreeable so adding to blood work today. - Hepatitis C Antibody - HIV Antibody (routine testing w rflx)  5. Prostate cancer screening Checking PSA. - PSA   Orders Placed This Encounter  Procedures   PSA   Hepatitis C Antibody   HIV Antibody (routine testing w rflx)   Ambulatory referral to Endocrinology    No orders of the defined types were placed in this encounter.   There are no Patient Instructions on file for this visit.  Follow-up plan:  Return in about 1 year (around 03/11/2023) for annual physical exam or sooner if needed.  Clearnce Sorrel, DNP, APRN, FNP-BC [facility]

## 2022-03-10 ENCOUNTER — Ambulatory Visit (INDEPENDENT_AMBULATORY_CARE_PROVIDER_SITE_OTHER): Payer: 59 | Admitting: Medical-Surgical

## 2022-03-10 ENCOUNTER — Encounter: Payer: Self-pay | Admitting: Medical-Surgical

## 2022-03-10 VITALS — BP 136/81 | HR 76 | Resp 20 | Ht 73.0 in | Wt 259.5 lb

## 2022-03-10 DIAGNOSIS — Z23 Encounter for immunization: Secondary | ICD-10-CM

## 2022-03-10 DIAGNOSIS — Z1211 Encounter for screening for malignant neoplasm of colon: Secondary | ICD-10-CM

## 2022-03-10 DIAGNOSIS — E079 Disorder of thyroid, unspecified: Secondary | ICD-10-CM | POA: Diagnosis not present

## 2022-03-10 DIAGNOSIS — Z1159 Encounter for screening for other viral diseases: Secondary | ICD-10-CM | POA: Diagnosis not present

## 2022-03-10 DIAGNOSIS — Z0000 Encounter for general adult medical examination without abnormal findings: Secondary | ICD-10-CM | POA: Diagnosis not present

## 2022-03-10 DIAGNOSIS — Z114 Encounter for screening for human immunodeficiency virus [HIV]: Secondary | ICD-10-CM

## 2022-03-10 DIAGNOSIS — Z125 Encounter for screening for malignant neoplasm of prostate: Secondary | ICD-10-CM

## 2022-03-11 LAB — HEPATITIS C ANTIBODY: Hepatitis C Ab: NONREACTIVE

## 2022-03-11 LAB — HIV ANTIBODY (ROUTINE TESTING W REFLEX): HIV 1&2 Ab, 4th Generation: NONREACTIVE

## 2022-03-11 LAB — PSA: PSA: 2.15 ng/mL (ref <=4.00)

## 2022-03-14 ENCOUNTER — Encounter: Payer: Self-pay | Admitting: Medical-Surgical

## 2022-03-14 ENCOUNTER — Ambulatory Visit: Payer: 59 | Admitting: Medical-Surgical

## 2022-03-14 VITALS — BP 137/76 | HR 62 | Resp 20 | Ht 73.0 in | Wt 261.6 lb

## 2022-03-14 DIAGNOSIS — L989 Disorder of the skin and subcutaneous tissue, unspecified: Secondary | ICD-10-CM | POA: Diagnosis not present

## 2022-03-14 DIAGNOSIS — Z23 Encounter for immunization: Secondary | ICD-10-CM | POA: Diagnosis not present

## 2022-03-14 NOTE — Progress Notes (Signed)
   SUBJECTIVE:  [name] is a 60 y.o. male who presents for lesion removal. We have already discussed this procedure, including option of not performing surgery, technique of surgery and potential for scarring at a recent visit.  OBJECTIVE:  Patient appears well. Vitals are normal. Skin: Intact.  ASSESSMENT:  seborrheic keratosis 1.5 cm x 1.5 cm to the mid thoracic just left of the spinal column. <0.5cm round flesh toned mole on the tip of the chin  PLAN:  After informed consent was obtained, using Chlorhexidine for cleansing and 1% Lidocaine without epinephrine for anesthetic, with sterile technique, shave excision and electrocautery was performed. Antibiotic dressing is applied, and wound care instructions provided.  Be alert for any signs of cutaneous infection. The procedure was well tolerated without complications. Follow up: the specimen is labeled and sent to pathology for evaluation, the patient may return prn.  Cryotherapy template Procedure: Cryodestruction of: mole of chin Consent obtained and verified. Time-out conducted. Noted no overlying erythema, induration, or other signs of local infection. Completed without difficulty using Cryo-Gun. Advised to call if fevers/chills, erythema, induration, drainage, or persistent bleeding. ___________________________________________ Clearnce Sorrel, DNP, APRN, FNP-BC [facility] [place]

## 2022-03-27 ENCOUNTER — Ambulatory Visit: Payer: 59 | Admitting: Medical-Surgical

## 2022-04-14 ENCOUNTER — Ambulatory Visit: Payer: 59 | Admitting: Medical-Surgical

## 2022-05-21 ENCOUNTER — Ambulatory Visit: Payer: 59 | Admitting: Psychiatry

## 2022-05-28 ENCOUNTER — Ambulatory Visit (INDEPENDENT_AMBULATORY_CARE_PROVIDER_SITE_OTHER): Payer: 59 | Admitting: Medical-Surgical

## 2022-05-28 VITALS — Temp 97.9°F

## 2022-05-28 DIAGNOSIS — Z23 Encounter for immunization: Secondary | ICD-10-CM

## 2022-05-28 NOTE — Progress Notes (Signed)
Agree with documentation as below.  ___________________________________________ Veleria Barnhardt L. Neeko Pharo, DNP, APRN, FNP-BC [facility] [place]  

## 2022-05-28 NOTE — Progress Notes (Signed)
Patient here for 2nd Shingrix vaccine.  Location: Right Deltoid

## 2022-12-02 ENCOUNTER — Ambulatory Visit: Payer: 59 | Admitting: Internal Medicine

## 2023-04-01 ENCOUNTER — Other Ambulatory Visit (HOSPITAL_BASED_OUTPATIENT_CLINIC_OR_DEPARTMENT_OTHER): Payer: Self-pay

## 2023-04-01 MED ORDER — AMOXICILLIN 500 MG PO CAPS
2000.0000 mg | ORAL_CAPSULE | ORAL | 0 refills | Status: AC
  Filled 2023-04-01: qty 25, 7d supply, fill #0
